# Patient Record
Sex: Female | Born: 1985 | Race: Black or African American | Hispanic: No | Marital: Single | Smoking: Current every day smoker
Health system: Southern US, Community
[De-identification: ages and names within clinical notes are randomized; demographics above are authoritative.]

## PROBLEM LIST (undated history)

## (undated) HISTORY — PX: TUBAL LIGATION: SHX77

---
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
## 2006-08-30 ENCOUNTER — Ambulatory Visit (HOSPITAL_COMMUNITY): Admission: RE | Admit: 2006-08-30 | Discharge: 2006-08-30 | Payer: Self-pay | Admitting: Obstetrics

## 2006-09-20 ENCOUNTER — Ambulatory Visit (HOSPITAL_COMMUNITY): Admission: RE | Admit: 2006-09-20 | Discharge: 2006-09-20 | Payer: Self-pay | Admitting: Obstetrics

## 2006-11-08 ENCOUNTER — Inpatient Hospital Stay (HOSPITAL_COMMUNITY): Admission: RE | Admit: 2006-11-08 | Discharge: 2006-11-11 | Payer: Self-pay | Admitting: Obstetrics

## 2008-12-08 ENCOUNTER — Ambulatory Visit: Payer: Self-pay | Admitting: Family

## 2008-12-08 ENCOUNTER — Inpatient Hospital Stay (HOSPITAL_COMMUNITY): Admission: AD | Admit: 2008-12-08 | Discharge: 2008-12-08 | Payer: Self-pay | Admitting: Obstetrics & Gynecology

## 2009-01-08 ENCOUNTER — Ambulatory Visit (HOSPITAL_COMMUNITY): Admission: RE | Admit: 2009-01-08 | Discharge: 2009-01-08 | Payer: Self-pay | Admitting: Obstetrics

## 2009-05-24 ENCOUNTER — Encounter (INDEPENDENT_AMBULATORY_CARE_PROVIDER_SITE_OTHER): Payer: Self-pay | Admitting: Obstetrics

## 2009-05-24 ENCOUNTER — Inpatient Hospital Stay (HOSPITAL_COMMUNITY): Admission: AD | Admit: 2009-05-24 | Discharge: 2009-05-27 | Payer: Self-pay | Admitting: Obstetrics

## 2010-07-03 ENCOUNTER — Emergency Department (HOSPITAL_COMMUNITY)
Admission: EM | Admit: 2010-07-03 | Discharge: 2010-07-03 | Payer: Self-pay | Source: Home / Self Care | Admitting: Emergency Medicine

## 2010-07-11 LAB — GLUCOSE, CAPILLARY: Glucose-Capillary: 100 mg/dL — ABNORMAL HIGH (ref 70–99)

## 2010-09-28 LAB — CBC
HCT: 26.6 % — ABNORMAL LOW (ref 36.0–46.0)
HCT: 37 % (ref 36.0–46.0)
Hemoglobin: 12.4 g/dL (ref 12.0–15.0)
Hemoglobin: 8.8 g/dL — ABNORMAL LOW (ref 12.0–15.0)
MCHC: 33.3 g/dL (ref 30.0–36.0)
MCHC: 33.4 g/dL (ref 30.0–36.0)
MCV: 91.4 fL (ref 78.0–100.0)
MCV: 91.7 fL (ref 78.0–100.0)
Platelets: 114 K/uL — ABNORMAL LOW (ref 150–400)
Platelets: 158 K/uL (ref 150–400)
RBC: 2.9 MIL/uL — ABNORMAL LOW (ref 3.87–5.11)
RBC: 4.05 MIL/uL (ref 3.87–5.11)
RDW: 13.3 % (ref 11.5–15.5)
RDW: 13.5 % (ref 11.5–15.5)
WBC: 8.7 K/uL (ref 4.0–10.5)
WBC: 9.8 K/uL (ref 4.0–10.5)

## 2010-09-28 LAB — RPR: RPR Ser Ql: NONREACTIVE

## 2010-10-03 LAB — URINALYSIS, ROUTINE W REFLEX MICROSCOPIC
Bilirubin Urine: NEGATIVE
Glucose, UA: NEGATIVE mg/dL
Hgb urine dipstick: NEGATIVE
Ketones, ur: NEGATIVE mg/dL
Nitrite: NEGATIVE
Protein, ur: NEGATIVE mg/dL
Specific Gravity, Urine: <1.005 — ABNORMAL LOW (ref 1.005–1.030)
Urobilinogen, UA: 0.2 mg/dL (ref 0.0–1.0)
pH: 5.5 (ref 5.0–8.0)

## 2010-10-03 LAB — GC/CHLAMYDIA PROBE AMP, GENITAL
Chlamydia, DNA Probe: NEGATIVE
GC Probe Amp, Genital: NEGATIVE

## 2010-10-03 LAB — POCT PREGNANCY, URINE: Preg Test, Ur: POSITIVE

## 2010-10-03 LAB — WET PREP, GENITAL
Clue Cells Wet Prep HPF POC: NONE SEEN
Yeast Wet Prep HPF POC: NONE SEEN

## 2010-10-03 LAB — URINE MICROSCOPIC-ADD ON

## 2010-11-08 NOTE — Op Note (Signed)
Sara Stevens, Sara Stevens                ACCOUNT NO.:  [id number]   MEDICAL RECORD NO.:  [id number]          PATIENT TYPE:  INP   LOCATION:  9199                           FACILITY:   PHYSICIAN:  Kathreen Cosier, M.D.DATE OF BIRTH:  May 07, 1986   DATE OF PROCEDURE:  11/08/2006  DATE OF DISCHARGE:                               OPERATIVE REPORT   PREOPERATIVE DIAGNOSIS:  Previous cesarean section at term, desires  repeat.   POSTOPERATIVE DIAGNOSIS:  Previous cesarean section at term, desires  repeat.   SURGEON:  Kathreen Cosier, MD.   FIRST ASSISTANT:  Charles A. Clearance Coots, MD.   ANESTHESIA:  Epidural.   PROCEDURE:  Patient placed on the operating table in the supine position  after the epidural had been administered, abdomen prepped and draped,  bladder emptied with a Foley catheter.  A transverse suprapubic incision  made and carried down to the rectus fascia, fascia cleaned and incised  the length of the incision.  The recti muscles were retracted laterally,  the peritoneum incised longitudinally.  A transverse incision made over  the visceral peritoneum above the bladder, the bladder mobilized  inferiorly.  A transverse lower uterine incision made.  Fluid was clear.  Patient delivered from the LOA position of a female, Apgar 8 and 9,  weighing 7 pounds 10 ounces, team in attendance.  The placenta anterior  fundal removed manually.  The uterine cavity cleaned with dry laps.  The  uterine incision closed in one layer with continuous suture of #1  chromic.  Hemostasis was satisfactory.  Bladder flap reattached with #2-  0 chromic.  The uterus was contracted, tubes and ovaries normal.  Abdomen closed in layers, peritoneum continuous suture of #0 chromic,  fascia continuous suture of #0 Dexon, and the skin closed with  subcuticular stitch of #4-0 Monocryl.  The patient tolerated the  procedure well, taken to the recovery room in good condition.     ______________________________  Kathreen Cosier, M.D.     BAM/MEDQ  D:  11/08/2006  T:  11/08/2006  Job:  [id number]

## 2010-11-11 NOTE — H&P (Signed)
   NAME:  Sara Stevens, Sara Stevens                          ACCOUNT NO.:  [id number]   MEDICAL RECORD NO.:  [id number]                   PATIENT TYPE:  INP   LOCATION:  9115                                 [facility]ACILITY[facility]   PHYSICIAN:  Kathreen Cosier, M.D.           DATE OF BIRTH:  04-20-1986   DATE OF ADMISSION:  04/02/2003  DATE OF DISCHARGE:                                HISTORY & PHYSICAL   HISTORY:  The patient is a 25 year old primigravida, [name] April 01, 2003.  She was in for induction at term at patient's request.  The cervix was 2 cm,  70%, and the vertex -3 station.  The membranes were ruptured at 12:15 p.m.  The fluid was clear.  Negative GBS.  The patient started contracting on her  own and by 3 p.m. she was contracting every four to five minutes.  Pitocin  was begun at that time.  At 4:30 p.m. IUPC was inserted.  She was having  mild variable contractions.  Cervix was still 2 cm, vertex, -3.  At 10:15  p.m., cervix 3 cm, more vertex, -3 with a lot of molding.  MVUs were greater  than 200 and she had had an Amnioinfusion going because of variable  decelerations.  11:30 p.m. cervix was unchanged.  It was decided that she  would be delivered by cesarean section for failure to progress in labor.   PHYSICAL EXAMINATION:  GENERAL:  Well-developed female in labor.  HEENT:  Negative.  LUNGS:  Clear.  HEART:  Regular rhythm.  No murmurs or gallops.  BREASTS:  Negative.  ABDOMEN:  Term sized uterus.  Estimated fetal weight 6 pounds 10 ounces.  PELVIC:  As described above.  EXTREMITIES:  Negative.                                               Kathreen Cosier, M.D.    BAM/MEDQ  D:  04/03/2003  T:  04/03/2003  Job:  [id number]

## 2010-11-11 NOTE — Op Note (Signed)
[name], Sara Stevens                ACCOUNT NO.:  [id number]   MEDICAL RECORD NO.:  [id number]          PATIENT TYPE:  INP   LOCATION:  9126                          [facility]ACILITY[facility]  [facility]   PHYSICIAN:  Kathreen Cosier, M.D.DATE OF BIRTH:  [birth year]   DATE OF PROCEDURE:  05/24/2005  DATE OF DISCHARGE:                                 OPERATIVE REPORT   PREOPERATIVE DIAGNOSIS:  Previous cesarean section at term, desires repeat.   SURGEON:  Kathreen Cosier, M.D.   FIRST ASSISTANT:  Charles A. Clearance Coots, M.D.   ANESTHESIA:  Spinal.   PROCEDURE:  Patient placed on the operating room table in supine position,  spinal administered, abdomen prepped and draped, bladder emptied with a  Foley catheter.  A transverse suprapubic incision made through the old scar  and carried down to the rectus fascia, the fascia cleaned and incised the  length of the incision.  Recti muscles retracted laterally, peritoneum  incised longitudinally.  A transverse incision made in the visceral  peritoneum above the bladder and the bladder mobilized inferiorly.  A  transverse lower uterine incision made.  The patient delivered from the OP  position of a female, Apgar 9 and 9, weight was 7 pounds 5 ounces.  The team  was in attendance, and the fluid was clear.  Placenta posterior, removed  manually, uterine cavity cleaned with dry laps.  The uterine incision closed  in one layer with continuous suture of #1 chromic.  Hemostasis was  satisfactory.  Bladder flap reattached with 2-0 chromic.  The uterus well-  contracted, tubes and ovaries normal.  Abdomen closed in layers, the  peritoneum with a continuous suture of 0 chromic, the fascia with continuous  suture of 0 Dexon and the skin closed with a subcuticular stitch of 4-0  Monocryl.  Blood loss 500 mL.  The patient tolerated her procedure well, was  taken to the recovery room in good condition.           ______________________________  Kathreen Cosier,  M.D.     BAM/MEDQ  D:  05/24/2005  T:  05/24/2005  Job:  [id number]

## 2010-11-11 NOTE — Op Note (Signed)
   NAME:  Sara Stevens, KINDIG                          ACCOUNT NO.:  [id number]   MEDICAL RECORD NO.:  [id number]                   PATIENT TYPE:  INP   LOCATION:  9115                                 [facility]ACILITY[facility]   PHYSICIAN:  Kathreen Cosier, M.D.           DATE OF BIRTH:  November 29, 1985   DATE OF PROCEDURE:  04/02/2003  DATE OF DISCHARGE:                                 OPERATIVE REPORT   PREOPERATIVE DIAGNOSIS:  Failure to progress in labor.   SURGEON:  Kathreen Cosier, M.D.   ANESTHESIA:  Epidural.   DESCRIPTION OF PROCEDURE:  Patient placed on the operating table in supine  position, abdomen prepped and draped, bladder emptied with a Foley catheter.  A transverse suprapubic incision made and carried down to the rectus fascia,  the fascia cleaned and incised the length of the incision.  The recti  muscles were retracted laterally, the peritoneum incised longitudinally.  A  transverse incision made in the visceral peritoneum above the bladder and  the bladder mobilized inferiorly.  A transverse lower uterine incision made.  The patient delivered from the OP position of a female, Apgars 9 and 9, weight  7 pounds 12 ounces.  The team was in attendance.  The fluid was clear.  The  placenta was anterior and removed manually.  The uterine cavity cleaned with  dry laps.  The uterine incision closed in one layer with continuous suture  of #1 chromic.  Hemostasis was satisfactory.  Bladder flap reattached with 2-  0 chromic.  Uterus well-contracted.  Tubes and ovaries normal.  Abdomen  closed in layers, peritoneum with continuous suture of 0 chromic, fascia  with continuous suture of 0 Dexon, the skin closed with subcuticular stitch  of 3-0 Monocryl.  Blood loss 500 mL.  The patient tolerates her procedure  well.                                               Kathreen Cosier, M.D.    BAM/MEDQ  D:  04/03/2003  T:  04/03/2003  Job:  [id number]

## 2010-11-11 NOTE — Discharge Summary (Signed)
Sara Stevens, Sara Stevens                ACCOUNT NO.:  [id number]   MEDICAL RECORD NO.:  [id number]          PATIENT TYPE:  INP   LOCATION:  9112                          FACILITY:  [facility]   PHYSICIAN:  Kathreen Cosier, M.D.DATE OF BIRTH:  [birth year]   DATE OF ADMISSION:  11/08/2006  DATE OF DISCHARGE:  11/11/2006                               DISCHARGE SUMMARY   The patient is a 25 year old, gravida 3, para 2-0-0-2 who had 2 previous  cesarean sections. Her due date is Nov 14, 2006, and she is now at term  and for repeat cesarean section. She had a repeat low transverse  Cesarean section, had a female, Apgars 8 and 9, weighing 7 pound 10  ounces, from the LOA position. Postoperatively, she did well. On  admission, her hemoglobin was 11.4, postoperative 8, platelets 218 and  144. PT/PTT normal. Urine normal. RPR nonreactive. HIV negative. The  patient was discharged on the third postoperative day, ambulatory on a  regular diet on Tylox for pain, to see me in 6 weeks.   DISCHARGE DIAGNOSIS:  Status post repeat low transverse Cesarean section  at term.           ______________________________  Kathreen Cosier, M.D.     BAM/MEDQ  D:  12/19/2006  T:  12/19/2006  Job:  [id number]

## 2010-11-11 NOTE — Discharge Summary (Signed)
Sara Stevens, RICCARDI                ACCOUNT NO.:  [id number]   MEDICAL RECORD NO.:  [id number]          PATIENT TYPE:  INP   LOCATION:  9126                          [facility]ILITY[facility]   PHYSICIAN:  Kathreen Cosier, M.D.DATE OF BIRTH:  [birth year]   DATE OF ADMISSION:  05/24/2005  DATE OF DISCHARGE:  05/27/2005                                 DISCHARGE SUMMARY   HOSPITAL COURSE:  The patient is an 25 year old gravida 2, para 1-0-0-1, had  a previous C-section and her due date was at June 02, 2005.  She desired  a repeat C-section.  On admission, her GBS was positive.  The patient  underwent a repeat low-transverse cesarean section, and she had a female.  Apgar 9 and 9 weighing 7 pounds and 5 ounces.  Postoperatively, she did  well.  On admission, her hemoglobin was 10.9 and 8 postop.  Platelets 251  and 187, white count 7 and 8.5.  Sodium 138, potassium 3.8, chloride 108,  CO2 23.  The patient was discharged on the third postoperative day  ambulatory on a regular diet.  To see me in six weeks.   DISCHARGE DIAGNOSIS:  Status post repeat low-transverse cesarean section at  term.           ______________________________  Kathreen Cosier, M.D.     BAM/MEDQ  D:  06/14/2005  T:  06/14/2005  Job:  [id number]

## 2010-11-11 NOTE — Discharge Summary (Signed)
   NAME:  Sara Stevens, Sara Stevens                          ACCOUNT NO.:  [id number]   MEDICAL RECORD NO.:  [id number]                   PATIENT TYPE:  INP   LOCATION:  9115                                 [facility]ILITY[facility]   PHYSICIAN:  Kathreen Cosier, M.D.           DATE OF BIRTH:  10/24/1985   DATE OF ADMISSION:  04/02/2003  DATE OF DISCHARGE:  04/06/2003                                 DISCHARGE SUMMARY   The patient is a 25 year old primigravida with estimated date of confinement  April 01, 2003 for elective induction.  Cervix 2 cm, 70%, vertex at -3  station.  She had artificial rupture of membranes with clear fluid.  She had  negative GBS.  The patient underwent a primary low transverse cesarean  section because of failure to progress and she had female infant with Apgar's  of 9 and 9, weighing 7 pounds, 12 ounces, from the occiput posterior  position.  Postoperatively she did well.  Her hemoglobin was 8.5.  She was  discharged home on the third postoperative day ambulatory on a regular diet.  On Tylox for pain and ferrous sulfate for anemia and Ortho-Evra for  contraception.   DISCHARGE DIAGNOSIS:  Status post primary low transverse cesarean section at  term for failure to progress in labor.                                                Kathreen Cosier, M.D.    BAM/MEDQ  D:  04/06/2003  T:  04/06/2003  Job:  [id number]

## 2010-11-27 IMAGING — US US OB COMP +14 WK
1 of 2 series · 14 of 28 positions shown · non-contrast
Comparison: none

OBSTETRICAL ULTRASOUND:
 This ultrasound exam was performed in the [HOSPITAL] Ultrasound Department.  The OB US report was generated in the AS system, and faxed to the ordering physician.  This report is also available in [REDACTED] PACS.

[Series 1: us ob comp +14 wk · 0.22mm/px · 80 acquisitions, 14 frames shown]
[im 1/80]
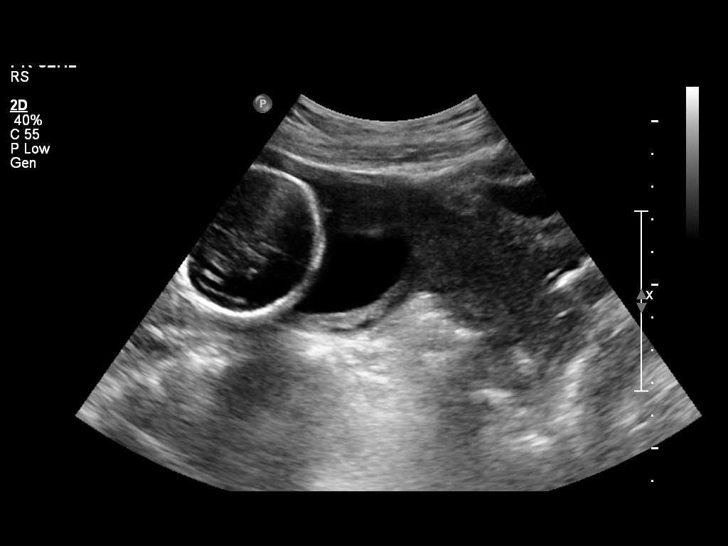
[im 7/80]
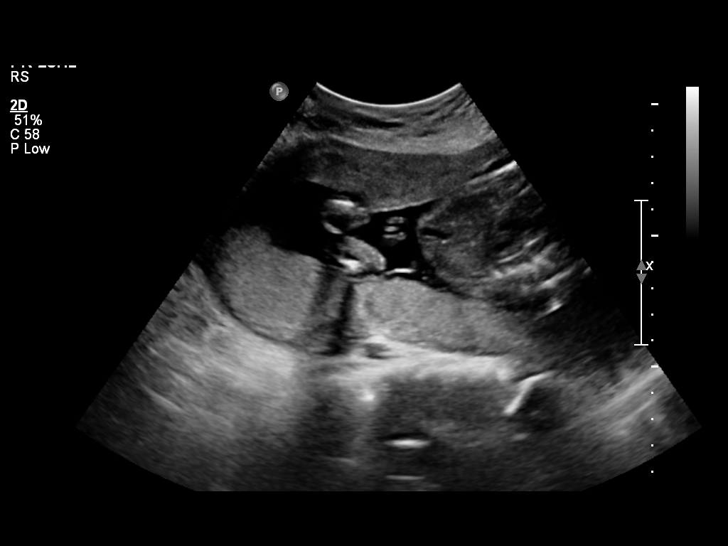
[im 13/80]
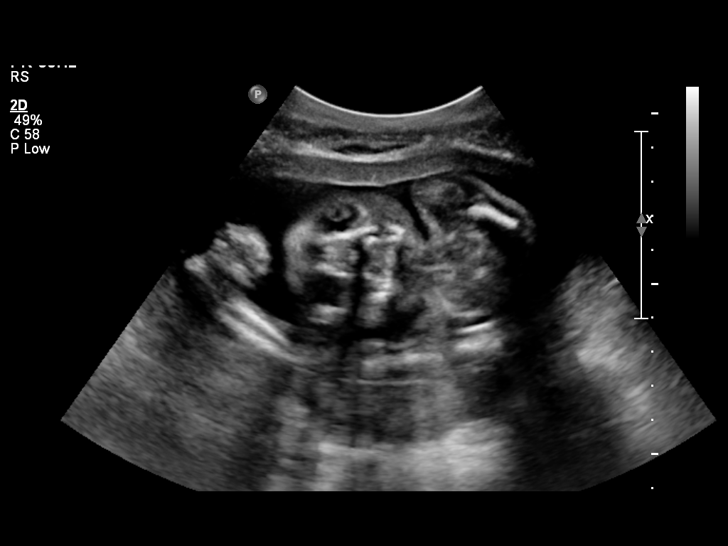
[im 19/80]
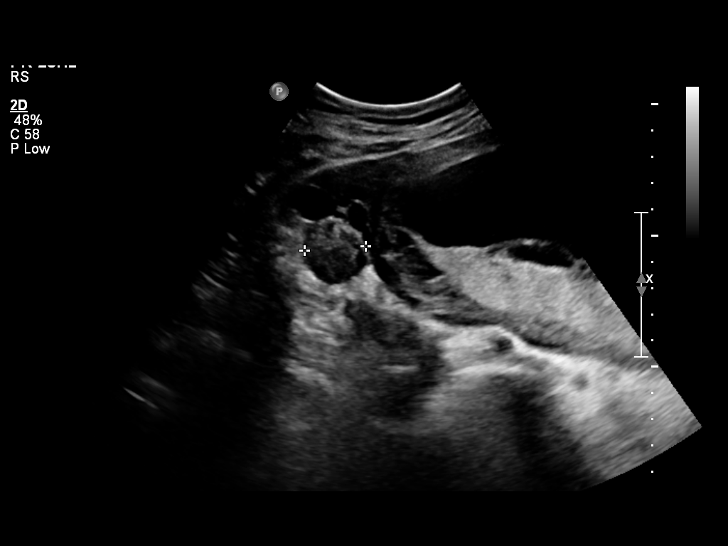
[im 25/80]
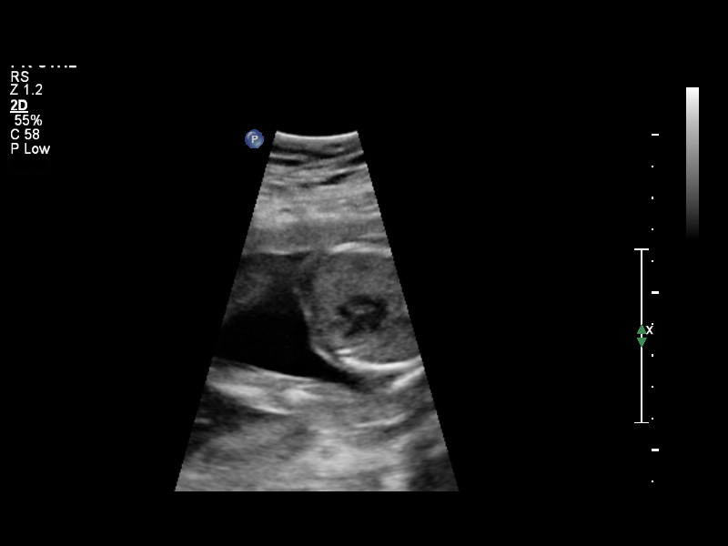
[im 31/80]
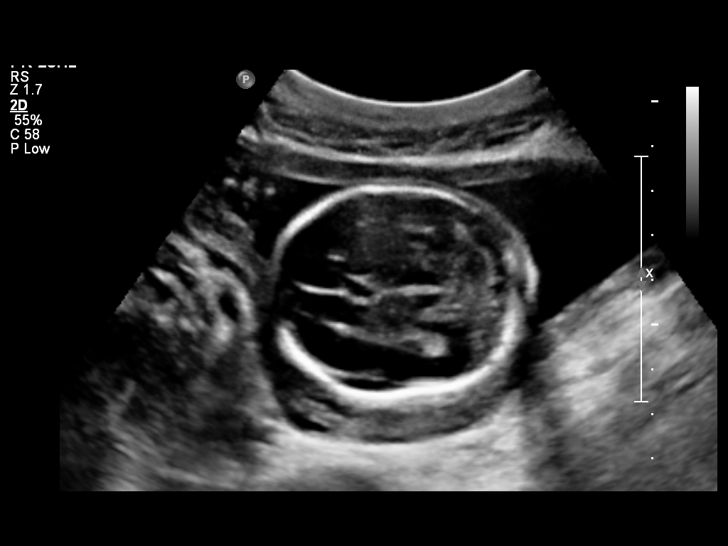
[im 37/80]
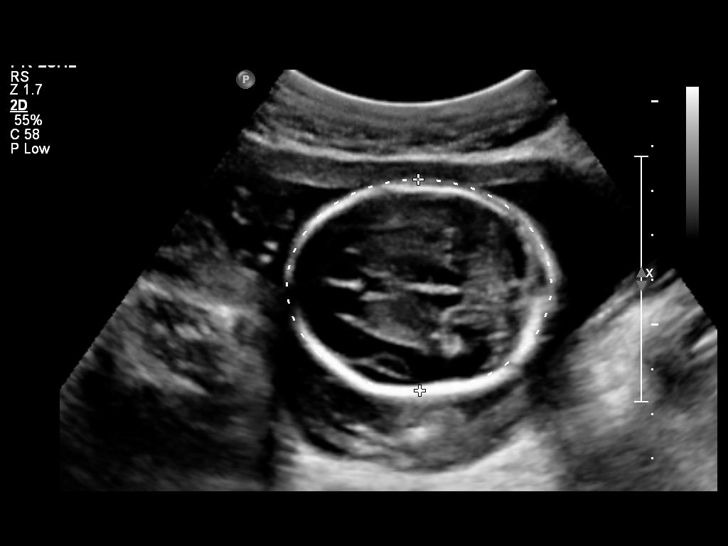
[im 43/80]
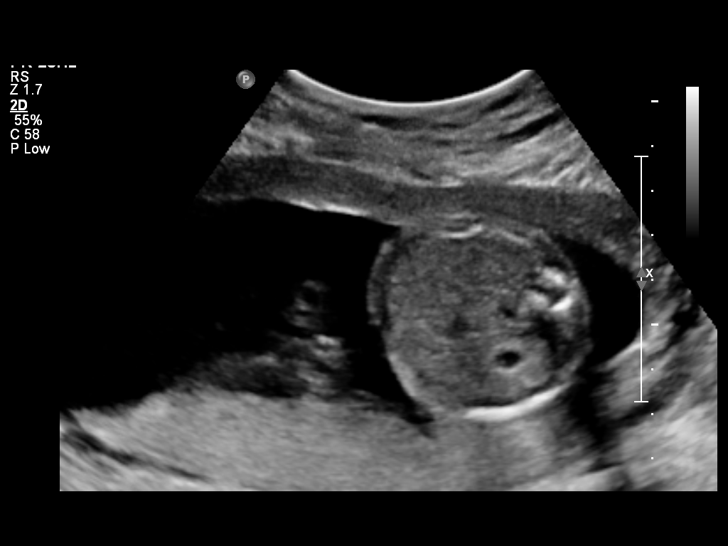
[im 49/80]
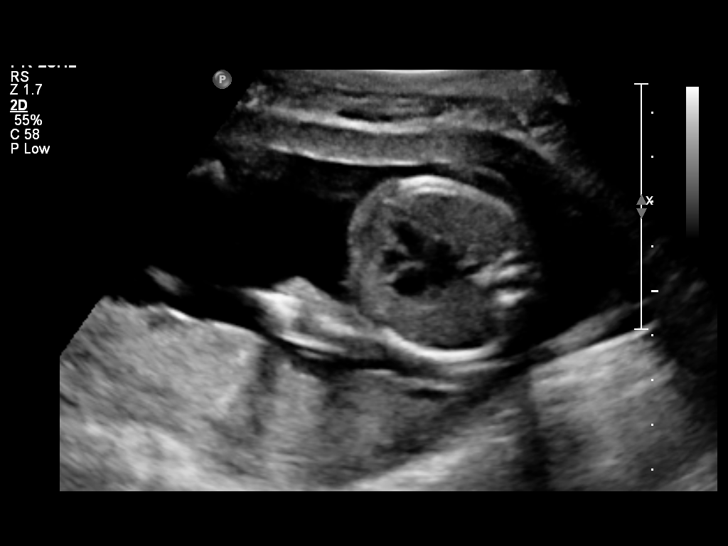
[im 55/80]
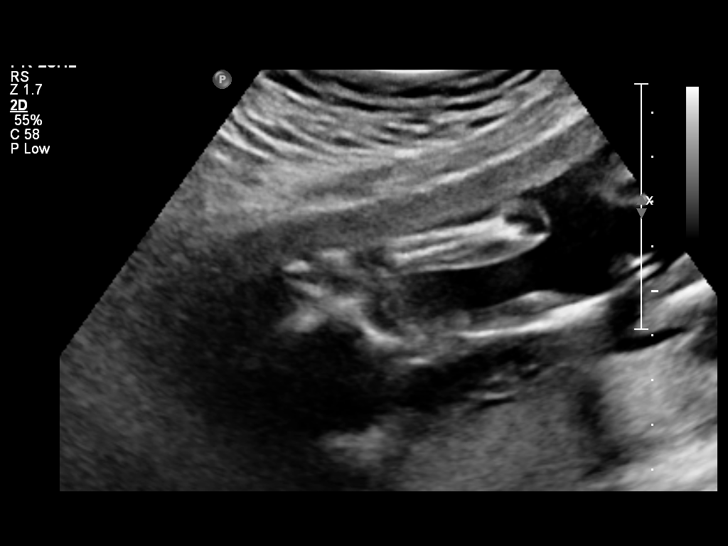
[im 61/80]
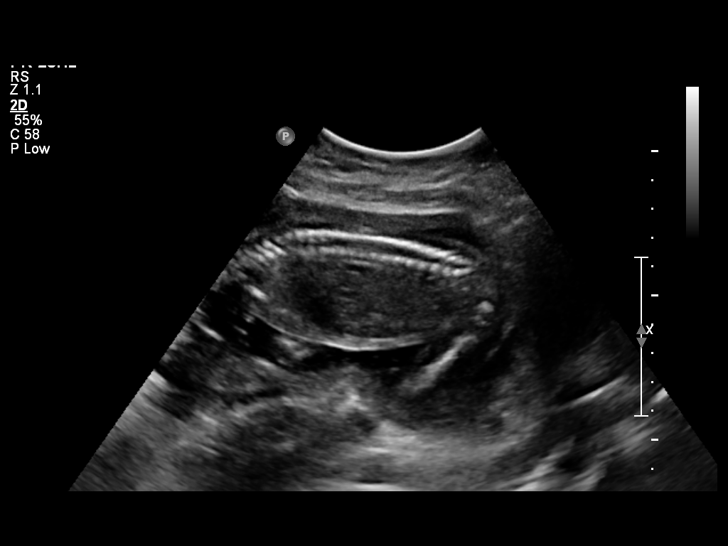
[im 67/80]
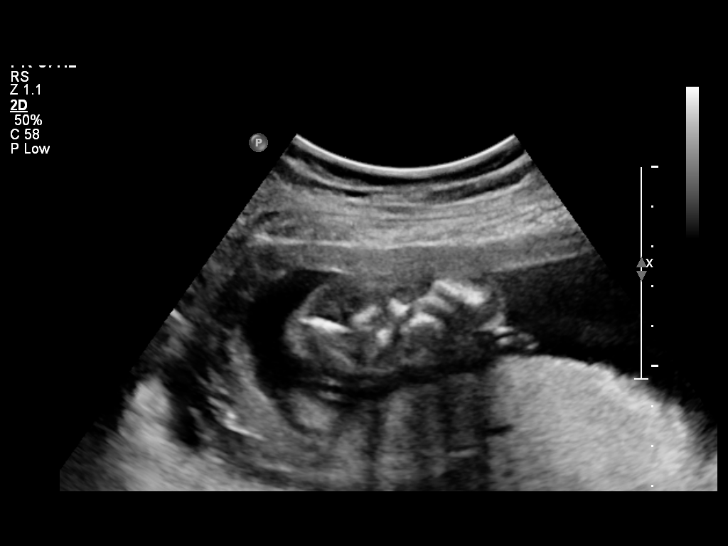
[im 73/80]
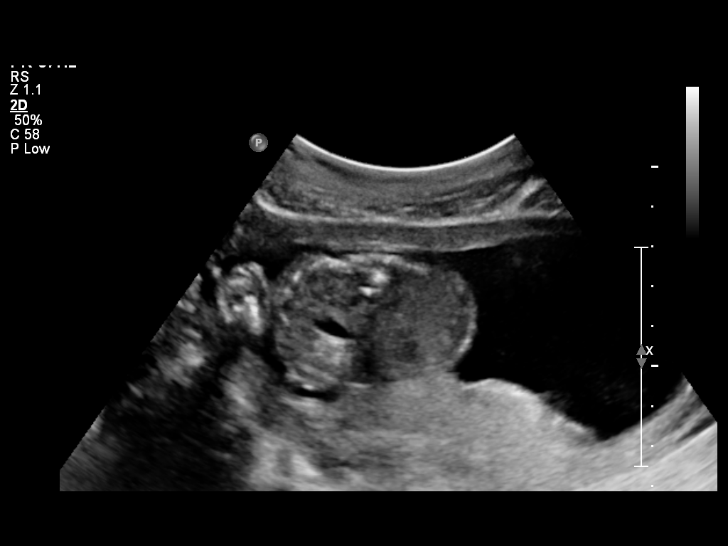
[im 80/80]
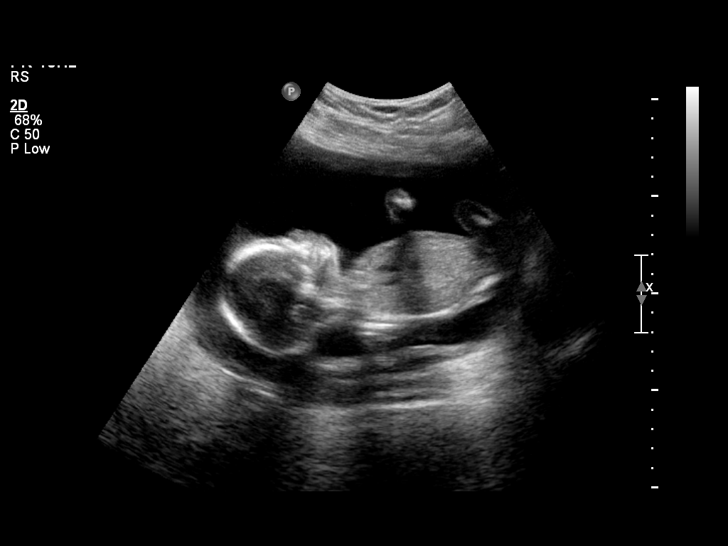

[14 of 28 positions shown; findings below may reference images not displayed]

IMPRESSION: See AS Obstetric US report.

## 2012-02-08 ENCOUNTER — Emergency Department (HOSPITAL_COMMUNITY)
Admission: EM | Admit: 2012-02-08 | Discharge: 2012-02-08 | Disposition: A | Discharge status: Home / Self Care | Payer: Medicaid Other | Attending: Emergency Medicine | Admitting: Emergency Medicine

## 2012-02-08 ENCOUNTER — Encounter (HOSPITAL_COMMUNITY): Payer: Self-pay | Admitting: Emergency Medicine

## 2012-02-08 DIAGNOSIS — S43499A Other sprain of unspecified shoulder joint, initial encounter: Secondary | ICD-10-CM | POA: Insufficient documentation

## 2012-02-08 DIAGNOSIS — M62838 Other muscle spasm: Secondary | ICD-10-CM

## 2012-02-08 MED ORDER — IBUPROFEN 800 MG PO TABS: 800.0000 mg | ORAL_TABLET | Freq: Three times a day (TID) | ORAL | Status: AC
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
## 2012-02-13 NOTE — ED Provider Notes (Signed)
Medical screening examination/treatment/procedure(s) were performed by non-physician practitioner and as supervising physician I was immediately available for consultation/collaboration.   Alanta Scobey, MD 02/13/12 0655 

## 2012-04-15 ENCOUNTER — Encounter (HOSPITAL_COMMUNITY): Payer: Self-pay

## 2012-04-15 ENCOUNTER — Emergency Department (HOSPITAL_COMMUNITY): Payer: Medicaid Other

## 2012-04-15 ENCOUNTER — Emergency Department (HOSPITAL_COMMUNITY)
Admission: EM | Admit: 2012-04-15 | Discharge: 2012-04-15 | Disposition: A | Discharge status: Home / Self Care | Payer: Medicaid Other | Attending: Emergency Medicine | Admitting: Emergency Medicine

## 2012-04-15 DIAGNOSIS — IMO0002 Reserved for concepts with insufficient information to code with codable children: Secondary | ICD-10-CM | POA: Insufficient documentation

## 2012-04-15 DIAGNOSIS — Y92410 Unspecified street and highway as the place of occurrence of the external cause: Secondary | ICD-10-CM | POA: Insufficient documentation

## 2012-04-15 DIAGNOSIS — Y939 Activity, unspecified: Secondary | ICD-10-CM | POA: Insufficient documentation

## 2012-04-15 MED ORDER — HYDROCODONE-ACETAMINOPHEN 5-325 MG PO TABS
1.0000 | ORAL_TABLET | Freq: Once | ORAL | Status: AC
  Administered 2012-04-15: 1 via ORAL
  Filled 2012-04-15: qty 1

## 2012-04-15 NOTE — ED Notes (Signed)
Patient transported to X-ray 
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
## 2013-12-11 ENCOUNTER — Emergency Department (HOSPITAL_COMMUNITY)
Admission: EM | Admit: 2013-12-11 | Discharge: 2013-12-12 | Disposition: A | Discharge status: Home / Self Care | Payer: Medicaid Other | Attending: Emergency Medicine | Admitting: Emergency Medicine

## 2013-12-11 ENCOUNTER — Encounter (HOSPITAL_COMMUNITY): Payer: Self-pay | Admitting: Emergency Medicine

## 2013-12-11 DIAGNOSIS — R0789 Other chest pain: Secondary | ICD-10-CM | POA: Insufficient documentation

## 2013-12-11 DIAGNOSIS — L02419 Cutaneous abscess of limb, unspecified: Secondary | ICD-10-CM | POA: Insufficient documentation

## 2013-12-11 DIAGNOSIS — L03119 Cellulitis of unspecified part of limb: Secondary | ICD-10-CM

## 2013-12-11 DIAGNOSIS — L02416 Cutaneous abscess of left lower limb: Secondary | ICD-10-CM

## 2013-12-11 NOTE — ED Notes (Signed)
Patient is alert and oriented x3.  She is complaining of intermittent chest pain that has been ongoing For over a month.  She also has complaints of insect bites to the left lower leg.  Currently she rates her  Pain 7 of 10 generalized.

## 2013-12-12 ENCOUNTER — Emergency Department (HOSPITAL_COMMUNITY): Payer: Medicaid Other

## 2013-12-12 MED ORDER — CLINDAMYCIN HCL 300 MG PO CAPS
300.0000 mg | ORAL_CAPSULE | Freq: Once | ORAL | Status: AC
  Administered 2013-12-12: 300 mg via ORAL
  Filled 2013-12-12: qty 1

## 2013-12-12 MED ORDER — CLINDAMYCIN HCL 300 MG PO CAPS: 300.0000 mg | ORAL_CAPSULE | Freq: Four times a day (QID) | ORAL | Status: DC

## 2013-12-12 NOTE — ED Provider Notes (Addendum)
Medical screening examination/treatment/procedure(s) were performed by non-physician practitioner and as supervising physician I was immediately available for consultation/collaboration.   EKG Interpretation   Date/Time:  Thursday December 11 2013 23:32:42 EDT Ventricular Rate:  99 PR Interval:  139 QRS Duration: 78 QT Interval:  352 QTC Calculation: 452 R Axis:   41 Text Interpretation:  Sinus rhythm Borderline T wave abnormalities not  Confirmed by Iness Pangilinan  MD, Lovelee Forner ([id number]) on 12/12/2013 5:24:53 AM       Olivia Mackielga M Debrah Granderson, MD 12/12/13 [id number]  Olivia Mackielga M Hektor Huston, MD 12/12/13 [phone]

## 2013-12-12 NOTE — Discharge Instructions (Signed)
Your provider's are concern for skin infections in your leg. Please take the antibiotics as prescribed and followup with a primary care provider to be sure your infection is improving. Please also followup with a primary care provider for evaluation of your chest pain symptoms.   Abscess An abscess is an infected area that contains a collection of pus and debris.It can occur in almost any part of the body. An abscess is also known as a furuncle or boil. CAUSES  An abscess occurs when tissue gets infected. This can occur from blockage of oil or sweat glands, infection of hair follicles, or a minor injury to the skin. As the body tries to fight the infection, pus collects in the area and creates pressure under the skin. This pressure causes pain. People with weakened immune systems have difficulty fighting infections and get certain abscesses more often.  SYMPTOMS Usually an abscess develops on the skin and becomes a painful mass that is red, warm, and tender. If the abscess forms under the skin, you may feel a moveable soft area under the skin. Some abscesses break open (rupture) on their own, but most will continue to get worse without care. The infection can spread deeper into the body and eventually into the bloodstream, causing you to feel ill.  DIAGNOSIS  Your caregiver will take your medical history and perform a physical exam. A sample of fluid may also be taken from the abscess to determine what is causing your infection. TREATMENT  Your caregiver may prescribe antibiotic medicines to fight the infection. However, taking antibiotics alone usually does not cure an abscess. Your caregiver may need to make a small cut (incision) in the abscess to drain the pus. In some cases, gauze is packed into the abscess to reduce pain and to continue draining the area. HOME CARE INSTRUCTIONS   Only take over-the-counter or prescription medicines for pain, discomfort, or fever as directed by your  caregiver.  If you were prescribed antibiotics, take them as directed. Finish them even if you start to feel better.  If gauze is used, follow your caregiver's directions for changing the gauze.  To avoid spreading the infection:  Keep your draining abscess covered with a bandage.  Wash your hands well.  Do not share personal care items, towels, or whirlpools with others.  Avoid skin contact with others.  Keep your skin and clothes clean around the abscess.  Keep all follow-up appointments as directed by your caregiver. SEEK MEDICAL CARE IF:   You have increased pain, swelling, redness, fluid drainage, or bleeding.  You have muscle aches, chills, or a general ill feeling.  You have a fever. MAKE SURE YOU:   Understand these instructions.  Will watch your condition.  Will get help right away if you are not doing well or get worse. Document Released: 03/22/2005 Document Revised: 12/12/2011 Document Reviewed: 08/25/2011 [facility] Patient Information 2015 [employer], [place]. This information is not intended to replace advice given to you by your health care provider. Make sure you discuss any questions you have with your health care provider.

## 2013-12-12 NOTE — ED Notes (Signed)
Pt states started w/ 1 bite on L lower leg, then had about 3 more bite spots appear, bite marks are reddened w/ white head, states painful also, pt states while she is here for the bite marks she also wants to get her chest pain that has been intermittent x 1 month checked out, states when the chest pain does come she feels like she's being "smoothered", states has SOB w/ it, central chest. Pt resting in bed at this time, in no distress.

## 2013-12-12 NOTE — ED Provider Notes (Signed)
CSN: [id number]     Arrival date & time 12/11/13  2326 History   First MD Initiated Contact with Patient 12/12/13 0057     Chief Complaint  Patient presents with  . Chest Pain    intermittent  . Insect Bite   HPI  History provided by the patient. Patient is a 28 year old female with no significant PMH who presents with complaints of areas of redness and swelling and pain to her left lower leg. Symptoms first began 2 days ago. She is concerned of possible insect bites. She denies any itching to the areas. She first had one spot which she popped and drained small amounts of pus and blood. This seemed to improve slightly but since then 3 other areas have arisen on different areas of the leg. She does shave her legs with a razor. Denies having similar symptoms previously. There has been no associated fever, chills or sweats. Patient also has secondary complaint of some continued chronic chest discomfort. She states that she also wanted to mention it because it has not improved significantly. Over the past several months she has had intermittent chest heaviness that feels like a squeezing sensation. This most often occurs when she lays flat but can occur anytime throughout the day. It usually only lasts several minutes and is accompanied with slight shortness of breath. She states she does not feel like she cannot fully breath in. Denies feeling anxious at this time. Denies having any indigestion, heartburn or belching.    History reviewed. No pertinent past medical history. History reviewed. No pertinent past surgical history. No family history on file. History  Substance Use Topics  . Smoking status: Never Smoker   . Smokeless tobacco: Not on file  . Alcohol Use: No   OB History   Grav Para Term Preterm Abortions TAB SAB Ect Mult Living                 Review of Systems  Constitutional: Positive for fever. Negative for chills, diaphoresis, appetite change and unexpected weight change.   HENT: Negative for congestion, rhinorrhea and sore throat.   Respiratory: Positive for shortness of breath. Negative for cough.   Cardiovascular: Positive for chest pain. Negative for palpitations.  Genitourinary: Negative for dysuria, frequency, hematuria and flank pain.  All other systems reviewed and are negative.     Allergies  Review of patient's allergies indicates no known allergies.  Home Medications   Prior to Admission medications   Medication Sig Start Date End Date Taking? Authorizing Provider  ibuprofen (ADVIL,MOTRIN) 200 MG tablet Take 400 mg by mouth every 6 (six) hours as needed for moderate pain.   Yes Historical Provider, MD  Ibuprofen-Diphenhydramine Cit (ADVIL PM PO) Take 2 tablets by mouth at bedtime as needed (pain/sleep).   Yes Historical Provider, MD   BP 136/78  Pulse 100  Temp(Src) 100 F (37.8 C) (Oral)  Resp 18  SpO2 99%  LMP 11/12/2013 Physical Exam  Nursing note and vitals reviewed. Constitutional: She is oriented to person, place, and time. She appears well-developed and well-nourished. No distress.  HENT:  Head: Normocephalic.  Cardiovascular: Normal rate and regular rhythm.   No murmur heard. Pulmonary/Chest: Effort normal and breath sounds normal. No respiratory distress. She has no wheezes. She has no rales.  Abdominal: Soft. There is no tenderness.  Neurological: She is alert and oriented to person, place, and time.  Skin: Skin is warm and dry. No rash noted.  4 small erythematous nodules with central  pustule.   Psychiatric: She has a normal mood and affect. Her behavior is normal.    ED Course  Procedures   COORDINATION OF CARE:  Nursing notes reviewed. Vital signs reviewed. Initial pt interview and examination performed.   Filed Vitals:   12/11/13 2333  BP: 136/78  Pulse: 100  Temp: 100 F (37.8 C)  TempSrc: Oral  Resp: 18  SpO2: 99%    1:35 AM- patient seen and evaluated. She is well appearing in no acute distress.  Afebrile. Does appear severely ill or toxic. Does have several small areas concerning for staph infection of the skin to the leg.   small needle I&D performed to several areas with only a small amount of purulent drainage. Will place patient on clindamycin and instructed to use warm compresses. She has been instructed to replace her razor and not shave for the next several days.  Chest x-ray and EKG unremarkable. No concerning findings for her ongoing intermittent chest discomforts. She does not have history of hypertension, diabetes or hypercholesterolemia. Nonsmoker. She does not have any significant risk factors for ACS. She is PERC negative. Symptoms are atypical and chronic. Doubt any emergent cause for her chest discomfort. We'll recommend continued PCP followup for evaluation.   Imaging Review Dg Chest 2 View  12/12/2013   CLINICAL DATA:  CHEST PAIN INSECT BITE  EXAM: CHEST  2 VIEW  COMPARISON:  None.  FINDINGS: The heart size and mediastinal contours are within normal limits. Both lungs are clear. The visualized skeletal structures are unremarkable.  IMPRESSION: No active cardiopulmonary disease.   Electronically Signed   By: Andreas NewportGeoffrey  Lamke M.D.   On: 12/12/2013 01:48     EKG Interpretation None      Date: 12/12/2013  Rate: 99  Rhythm: normal sinus rhythm  QRS Axis: normal  Intervals: normal  ST/T Wave abnormalities: normal  Conduction Disutrbances:none  Narrative Interpretation:   Old EKG Reviewed: none available    MDM   Final diagnoses:  Abscess of leg, left  Atypical chest pain        Angus Sellereter S Dammen, PA-C 12/12/13 0256

## 2015-10-31 IMAGING — CR DG CHEST 2V
2 series · 2 of 2 positions shown · non-contrast
Comparison: None.

CLINICAL DATA: CHEST PAIN INSECT BITE

EXAM:
CHEST  2 VIEW

[w chest pa]
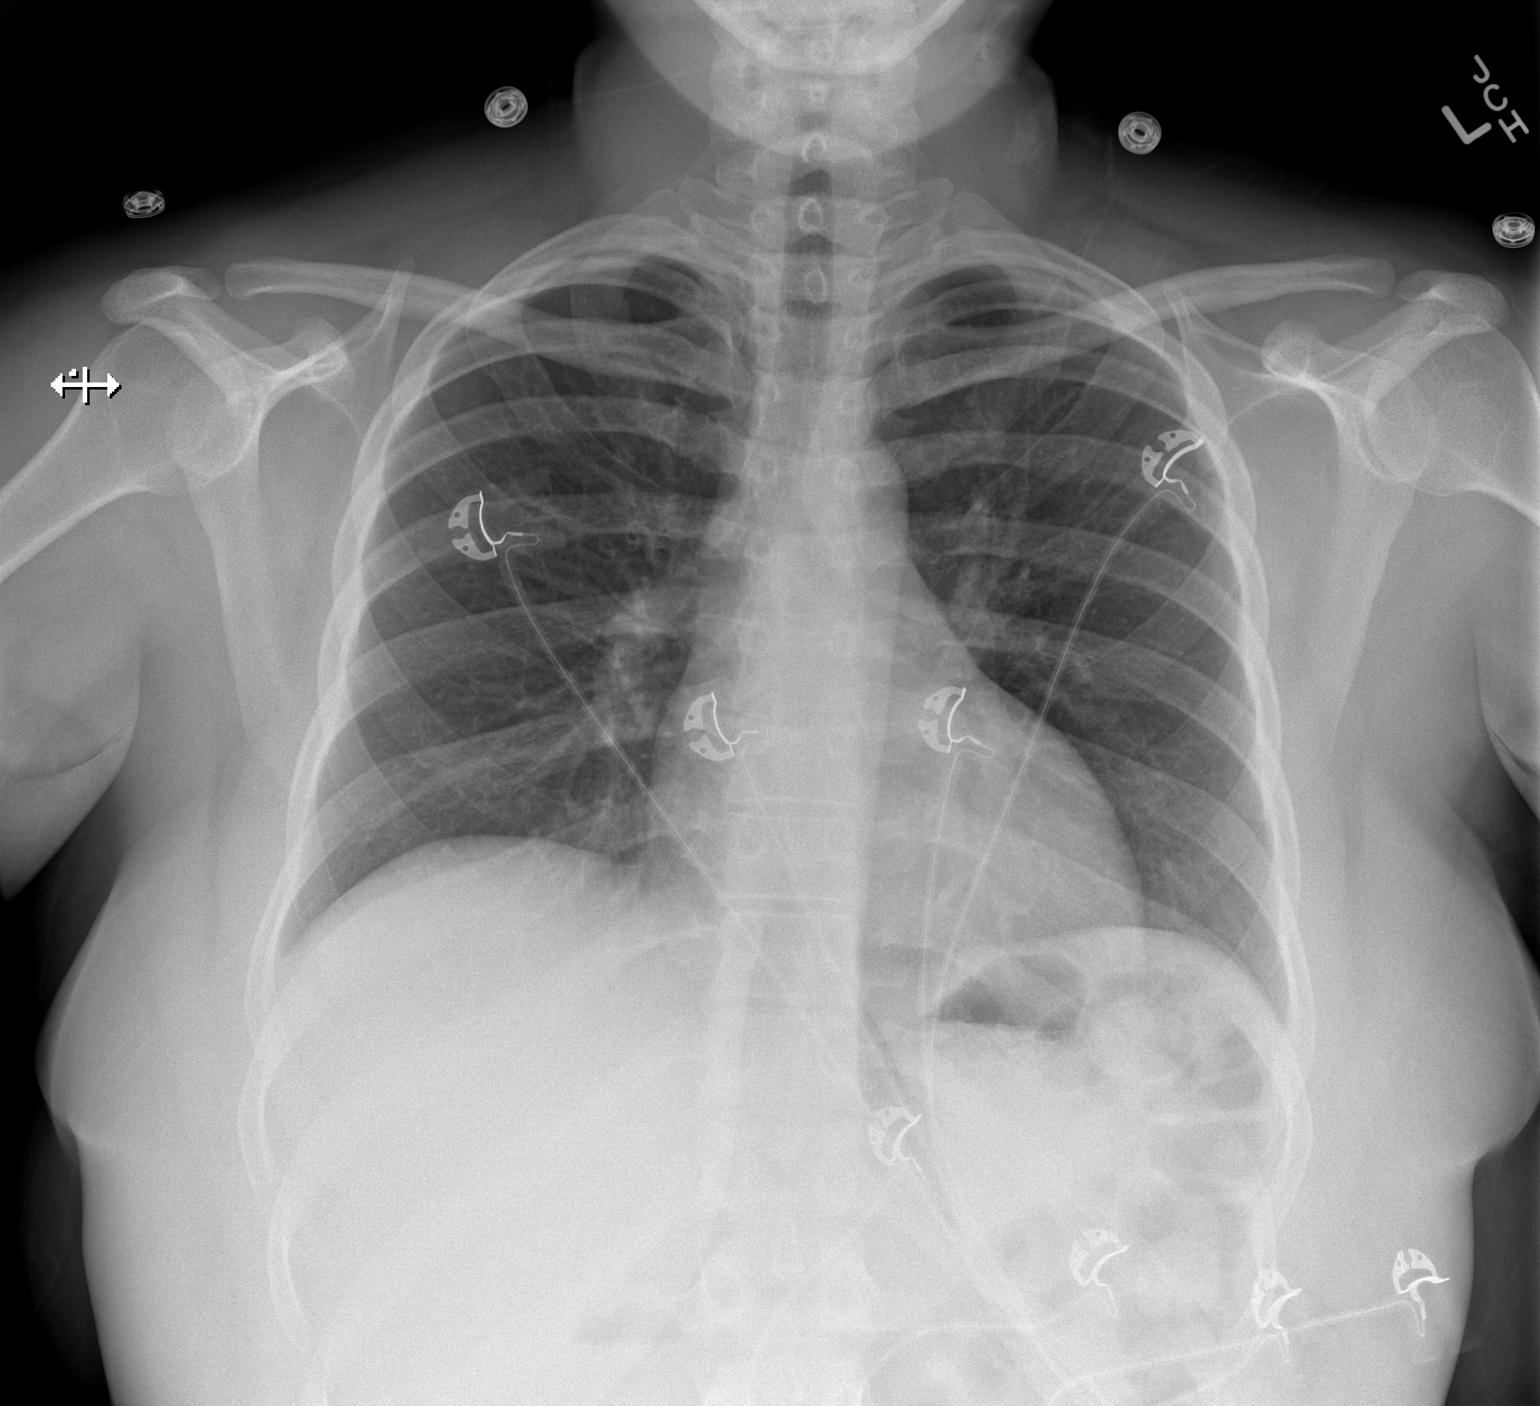

[w chest lat]
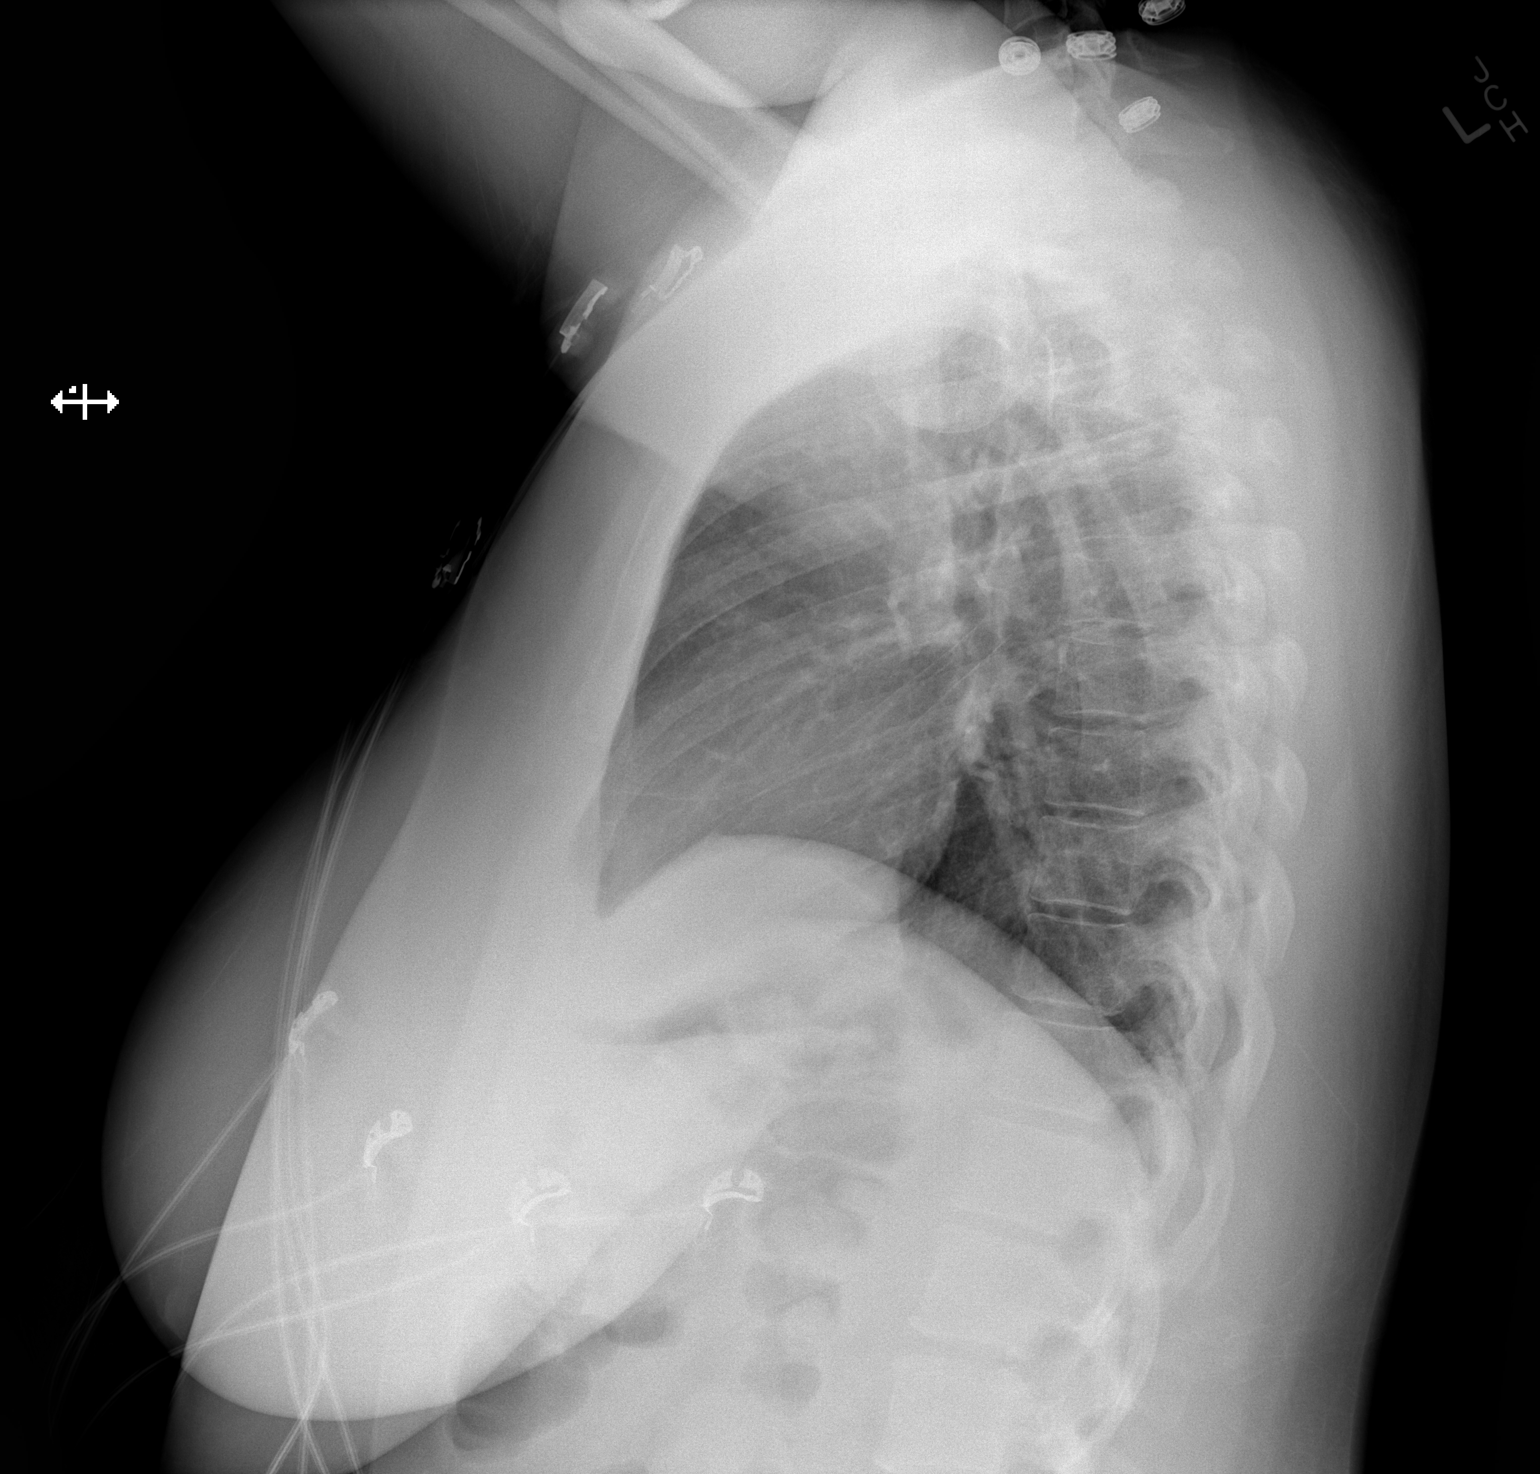

[2 of 2 positions shown; findings below may reference images not displayed]

FINDINGS: The heart size and mediastinal contours are within normal limits.
Both lungs are clear. The visualized skeletal structures are
unremarkable.
IMPRESSION: No active cardiopulmonary disease.

## 2016-12-11 ENCOUNTER — Ambulatory Visit (HOSPITAL_COMMUNITY)
Admission: EM | Admit: 2016-12-11 | Discharge: 2016-12-11 | Disposition: A | Discharge status: Home / Self Care | Payer: Medicaid Other | Attending: Family Medicine | Admitting: Family Medicine

## 2016-12-11 ENCOUNTER — Encounter (HOSPITAL_COMMUNITY): Payer: Self-pay | Admitting: Emergency Medicine

## 2016-12-11 DIAGNOSIS — S46811A Strain of other muscles, fascia and tendons at shoulder and upper arm level, right arm, initial encounter: Secondary | ICD-10-CM | POA: Diagnosis not present

## 2016-12-11 DIAGNOSIS — T148XXA Other injury of unspecified body region, initial encounter: Secondary | ICD-10-CM

## 2016-12-11 DIAGNOSIS — X503XXA Overexertion from repetitive movements, initial encounter: Secondary | ICD-10-CM

## 2016-12-11 MED ORDER — NAPROXEN 500 MG PO TBEC
500.0000 mg | DELAYED_RELEASE_TABLET | Freq: Two times a day (BID) | ORAL | 0 refills | Status: DC
Start: 2016-12-11 — End: 2018-05-15

## 2016-12-11 MED ORDER — DICLOFENAC SODIUM 1 % TD GEL: 1.0000 "application " | Freq: Four times a day (QID) | TRANSDERMAL | 0 refills | Status: DC

## 2016-12-11 NOTE — ED Triage Notes (Signed)
Pt c/o right arm and shoulder pain onset 2 days   Sts she works at [employer] and is constantly lifting heavy objects  Pain increases w/activity  A&O x4... NAD... Ambulatory

## 2016-12-11 NOTE — Discharge Instructions (Signed)
Apply heat to the muscles or most painful particularly the hour aspect of the right upper arm. Rest for the next 2-3 days. Wear the sling for only 8 hours during the day but remove the sling to move the shoulder around. Take the medications as directed, apply heat to help with muscle relaxation and circulation. When you go back to work consider using the ThermaCare wraps or any other heat application to the shoulder and arm muscles to help with muscle relaxation. Think about ways he can alter your type of work to take the pressure and forced off the right upper arm. Follow-up with your primary care doctor as needed

## 2016-12-11 NOTE — ED Provider Notes (Signed)
CSN: [id number]     Arrival date & time 12/11/16  1803 History   First MD Initiated Contact with Patient 12/11/16 1902     Chief Complaint  Patient presents with  . Shoulder Pain   (Consider location/radiation/quality/duration/timing/severity/associated sxs/prior Treatment) 31 year old female complaining of pain to the right upper outer arm, right trapezius and some shooting pains to the right forearm and wrist. This started shortly after obtaining a new job where she has to do a lot of lifting and repetitive motion of the arms and particularly the right arm. She has limited ability to abduct the right arm due to pain in the deltoid muscle. Denies any known injury, trauma. She admits that you have repetitive activity of her job is the cause. 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 This causes some spasms and freezing of the digits where it is difficult to flex and extend.      History reviewed. No pertinent past medical history. No past surgical history on file. History reviewed. No pertinent family history. Social History  Substance Use Topics  . Smoking status: Current Every Day Smoker    Types: Cigarettes  . Smokeless tobacco: Never Used  . Alcohol use No   OB History    No data available     Review of Systems  Constitutional: Negative for activity change, chills and fever.  HENT: Negative.   Respiratory: Negative.   Cardiovascular: Negative.   Musculoskeletal:       As per HPI  Skin: Negative for color change, pallor and rash.  Neurological: Negative.     Allergies  Patient has no known allergies.  Home Medications   Prior to Admission medications   Medication Sig Start Date End Date Taking? Authorizing Provider  clindamycin (CLEOCIN) 300 MG capsule Take 1 capsule (300 mg total) by mouth 4 (four) times daily. X 7 days 12/12/13   Ivonne Andrew, PA-C  diclofenac sodium (VOLTAREN) 1 % GEL Apply 1 application  topically 4 (four) times daily. 12/11/16   Hayden Rasmussen, NP  ibuprofen (ADVIL,MOTRIN) 200 MG tablet Take 400 mg by mouth every 6 (six) hours as needed for moderate pain.    [provider]  Ibuprofen-Diphenhydramine Cit (ADVIL PM PO) Take 2 tablets by mouth at bedtime as needed (pain/sleep).    [provider]  naproxen (EC-NAPROSYN) 500 MG EC tablet Take 1 tablet (500 mg total) by mouth 2 (two) times daily with a meal. 12/11/16   Hayden Rasmussen, NP   Meds Ordered and Administered this Visit  Medications - No data to display  BP 131/70 (BP Location: Left Arm)   Pulse 71   Temp 98.3 F (36.8 C) (Oral)   Resp 16   LMP 11/05/2016   SpO2 100%  No data found.   Physical Exam  Constitutional: She is oriented to person, place, and time. She appears well-developed and well-nourished. No distress.  HENT:  Head: Normocephalic and atraumatic.  Eyes: EOM are normal.  Neck: Normal range of motion. Neck supple.  Cardiovascular: Normal rate.   Pulmonary/Chest: Effort normal.  Musculoskeletal:  Mild tenderness to the trapezius and supraspinatus muscle on the right. Greater tenderness to the right deltoid muscle and lesser tenderness to the forearm. Patient is unable to abduct the right arm secondary to pain in the deltoid muscle. No shoulder joint tenderness. Passive range of motion is normal. Distal neurovascular motor sensory is grossly intact.  Neurological: She is alert and oriented  to person, place, and time. No cranial nerve deficit.  Skin: Skin is warm and dry.  Nursing note and vitals reviewed.   Urgent Care Course     Procedures (including critical care time)  Labs Review Labs Reviewed - No data to display  Imaging Review No results found.   Visual Acuity Review  Right Eye Distance:   Left Eye Distance:   Bilateral Distance:    Right Eye Near:   Left Eye Near:    Bilateral Near:         MDM   1. Strain of deltoid muscle, right, initial encounter   2.  Trapezius strain, right, initial encounter   3. Repetitive strain injury    Apply heat to the muscles or most painful particularly the hour aspect of the right upper arm. Rest for the next 2-3 days. Wear the sling for only 8 hours during the day but remove the sling to move the shoulder around. Take the medications as directed, apply heat to help with muscle relaxation and circulation. When you go back to work consider using the ThermaCare wraps or any other heat application to the shoulder and arm muscles to help with muscle relaxation. Think about ways he can alter your type of work to take the pressure and forced off the right upper arm. Follow-up with your primary care doctor as needed Meds ordered this encounter  Medications  . diclofenac sodium (VOLTAREN) 1 % GEL    Sig: Apply 1 application topically 4 (four) times daily.    Dispense:  100 g    Refill:  0    Order Specific Question:   Supervising Provider    Answer:   Elvina SidleLAUENSTEIN, KURT [[id number]]  . naproxen (EC-NAPROSYN) 500 MG EC tablet    Sig: Take 1 tablet (500 mg total) by mouth 2 (two) times daily with a meal.    Dispense:  24 tablet    Refill:  0    Order Specific Question:   Supervising Provider    Answer:   Maudry DiegoLAUENSTEIN, KURT [[id number]]       [name], NP 12/11/16 1926

## 2017-07-29 DIAGNOSIS — W57XXXA Bitten or stung by nonvenomous insect and other nonvenomous arthropods, initial encounter: Secondary | ICD-10-CM | POA: Insufficient documentation

## 2017-07-29 DIAGNOSIS — Y939 Activity, unspecified: Secondary | ICD-10-CM | POA: Diagnosis not present

## 2017-07-29 DIAGNOSIS — S60562A Insect bite (nonvenomous) of left hand, initial encounter: Secondary | ICD-10-CM | POA: Insufficient documentation

## 2017-07-29 DIAGNOSIS — J029 Acute pharyngitis, unspecified: Secondary | ICD-10-CM | POA: Insufficient documentation

## 2017-07-29 DIAGNOSIS — F17210 Nicotine dependence, cigarettes, uncomplicated: Secondary | ICD-10-CM | POA: Insufficient documentation

## 2017-07-29 DIAGNOSIS — S60561A Insect bite (nonvenomous) of right hand, initial encounter: Secondary | ICD-10-CM | POA: Insufficient documentation

## 2017-07-29 DIAGNOSIS — Y929 Unspecified place or not applicable: Secondary | ICD-10-CM | POA: Diagnosis not present

## 2017-07-29 DIAGNOSIS — Y999 Unspecified external cause status: Secondary | ICD-10-CM | POA: Insufficient documentation

## 2017-07-30 ENCOUNTER — Encounter (HOSPITAL_COMMUNITY): Payer: Self-pay

## 2017-07-30 ENCOUNTER — Emergency Department (HOSPITAL_COMMUNITY)
Admission: EM | Admit: 2017-07-30 | Discharge: 2017-07-30 | Disposition: A | Discharge status: Home / Self Care | Payer: Medicaid Other | Attending: Emergency Medicine | Admitting: Emergency Medicine

## 2017-07-30 DIAGNOSIS — W57XXXA Bitten or stung by nonvenomous insect and other nonvenomous arthropods, initial encounter: Secondary | ICD-10-CM

## 2017-07-30 DIAGNOSIS — J029 Acute pharyngitis, unspecified: Secondary | ICD-10-CM

## 2017-07-30 NOTE — ED Triage Notes (Signed)
Pt reports that her throat has been sore for about 4 days and has missed 2 days of work. She states that it hurts to swallow. Denies fever, nausea or vomiting. She also has what appears to be bug bites on her hands feet and stomach and wants some cream for them. A&Ox4.

## 2017-07-30 NOTE — ED Provider Notes (Signed)
[facility]-EMERGENCY DEPT Provider Note   CSN: [id number] Arrival date & time: 07/29/17  2341     History   Chief Complaint Chief Complaint  Patient presents with  . Sore Throat    HPI Sara Stevens is a 32 y.o. female.  No congestion, cough, fever.    The history is provided by the patient. No language interpreter was used.  Sore Throat  This is a new problem. The current episode started 2 days ago. The problem occurs constantly. The problem has not changed since onset.Pertinent negatives include no chest pain, no abdominal pain, no headaches and no shortness of breath. The symptoms are aggravated by swallowing. She has tried nothing for the symptoms.    History reviewed. No pertinent past medical history.  There are no active problems to display for this patient.   History reviewed. No pertinent surgical history.  OB History    No data available       Home Medications    Prior to Admission medications   Medication Sig Start Date End Date Taking? Authorizing Provider  clindamycin (CLEOCIN) 300 MG capsule Take 1 capsule (300 mg total) by mouth 4 (four) times daily. X 7 days 12/12/13   Ivonne Andrew, PA-C  diclofenac sodium (VOLTAREN) 1 % GEL Apply 1 application topically 4 (four) times daily. 12/11/16   Hayden Rasmussen, NP  ibuprofen (ADVIL,MOTRIN) 200 MG tablet Take 400 mg by mouth every 6 (six) hours as needed for moderate pain.    [provider]  Ibuprofen-Diphenhydramine Cit (ADVIL PM PO) Take 2 tablets by mouth at bedtime as needed (pain/sleep).    [provider]  naproxen (EC-NAPROSYN) 500 MG EC tablet Take 1 tablet (500 mg total) by mouth 2 (two) times daily with a meal. 12/11/16   Hayden Rasmussen, NP    Family History History reviewed. No pertinent family history.  Social History Social History   Tobacco Use  . Smoking status: Current Every Day Smoker    Types: Cigarettes  . Smokeless tobacco: Never Used  Substance Use  Topics  . Alcohol use: No  . Drug use: No     Allergies   Patient has no known allergies.   Review of Systems Review of Systems  Constitutional: Negative for fever.  HENT: Positive for sore throat. Negative for congestion and trouble swallowing.   Respiratory: Negative for cough and shortness of breath.   Cardiovascular: Negative for chest pain.  Gastrointestinal: Negative for abdominal pain and nausea.  Neurological: Negative for headaches.     Physical Exam Updated Vital Signs BP 127/80 (BP Location: Left Arm)   Pulse 71   Temp 99.1 F (37.3 C) (Oral)   Resp 16   SpO2 92%   Physical Exam  Constitutional: She is oriented to person, place, and time. She appears well-developed and well-nourished.  HENT:  Head: Normocephalic.  Mouth/Throat: Oropharynx is clear and moist and mucous membranes are normal. No uvula swelling. No oropharyngeal exudate.  Tonsils absent.  Neck: Normal range of motion.  Pulmonary/Chest: Effort normal.  Lymphadenopathy:    She has no cervical adenopathy.  Neurological: She is alert and oriented to person, place, and time.  Skin: Skin is warm and dry.  Multiple raised red bumps on dorsum of hands bilaterally. No pustules, blisters or bleeding.      ED Treatments / Results  Labs (all labs ordered are listed, but only abnormal results are displayed) Labs Reviewed - No data to display  EKG  EKG  Interpretation None       Radiology No results found.  Procedures Procedures (including critical care time)  Medications Ordered in ED Medications - No data to display   Initial Impression / Assessment and Plan / ED Course  I have reviewed the triage vital signs and the nursing notes.  Pertinent labs & imaging results that were available during my care of the patient were reviewed by me and considered in my medical decision making (see chart for details).     Patient presents with sore throat x 2 days. No fever, cough or congestion.  Exam is without abnormality. VSS. She has multiple insect bites to hands that appear c/w bed bugs. She can be discharged home recommending supportive care.   Final Clinical Impressions(s) / ED Diagnoses   Final diagnoses:  Pharyngitis, unspecified etiology  Bedbug bite, initial encounter    ED Discharge Orders    None       Elpidio AnisUpstill, Author [place], PA-C 07/30/17 [id number]    Geoffery Lyonselo, [place], MD 07/30/17 [phone]

## 2018-05-15 ENCOUNTER — Other Ambulatory Visit: Payer: Self-pay

## 2018-05-15 ENCOUNTER — Ambulatory Visit (HOSPITAL_COMMUNITY)
Admission: EM | Admit: 2018-05-15 | Discharge: 2018-05-15 | Disposition: A | Discharge status: Home / Self Care | Payer: Medicaid Other | Attending: Family Medicine | Admitting: Family Medicine

## 2018-05-15 ENCOUNTER — Encounter (HOSPITAL_COMMUNITY): Payer: Self-pay | Admitting: Emergency Medicine

## 2018-05-15 DIAGNOSIS — J0100 Acute maxillary sinusitis, unspecified: Secondary | ICD-10-CM | POA: Diagnosis not present

## 2018-05-15 MED ORDER — AMOXICILLIN-POT CLAVULANATE 875-125 MG PO TABS: 1.0000 | ORAL_TABLET | Freq: Two times a day (BID) | ORAL | 0 refills | Status: AC

## 2018-05-15 NOTE — ED Provider Notes (Signed)
[facility]   [id number] 05/15/18 Arrival Time: 1000  Cc: URI symptoms  SUBJECTIVE:  Sara Stevens is a 32 y.o. female who presents with nasal congestion, runny nose, sinus pressure and pain, and cough x 1 week.  Denies positive sick exposure or precipitating event.  Describes cough as intermittent and productive with thick dark green sputum.  Has tried tylenol without relief.  Denies aggravating factors.  Denies previous symptoms in the past.  Complains of associated fatigue, and sore throat. Denies fever, chills, SOB, wheezing, chest pain, nausea, changes in bowel or bladder habits.    ROS: As per HPI.  History reviewed. No pertinent past medical history. Past Surgical History:  Procedure Laterality Date  . CESAREAN SECTION    . TUBAL LIGATION     No Known Allergies No current facility-administered medications on file prior to encounter.    Current Outpatient Medications on File Prior to Encounter  Medication Sig Dispense Refill  . ibuprofen (ADVIL,MOTRIN) 200 MG tablet Take 400 mg by mouth every 6 (six) hours as needed for moderate pain.    . Ibuprofen-Diphenhydramine Cit (ADVIL PM PO) Take 2 tablets by mouth at bedtime as needed (pain/sleep).      Social History   Socioeconomic History  . Marital status: Single    Spouse name: Not on file  . Number of children: Not on file  . Years of education: Not on file  . Highest education level: Not on file  Occupational History  . Not on file  Social Needs  . Financial resource strain: Not on file  . Food insecurity:    Worry: Not on file    Inability: Not on file  . Transportation needs:    Medical: Not on file    Non-medical: Not on file  Tobacco Use  . Smoking status: Current Every Day Smoker    Types: Cigarettes  . Smokeless tobacco: Never Used  Substance and Sexual Activity  . Alcohol use: No  . Drug use: No  . Sexual activity: Never    Birth control/protection: Surgical  Lifestyle  . Physical  activity:    Days per week: Not on file    Minutes per session: Not on file  . Stress: Not on file  Relationships  . Social connections:    Talks on phone: Not on file    Gets together: Not on file    Attends religious service: Not on file    Active member of club or organization: Not on file    Attends meetings of clubs or organizations: Not on file    Relationship status: Not on file  . Intimate partner violence:    Fear of current or ex partner: Not on file    Emotionally abused: Not on file    Physically abused: Not on file    Forced sexual activity: Not on file  Other Topics Concern  . Not on file  Social History Narrative  . Not on file   Family History  Problem Relation Age of Onset  . Healthy Mother   . Healthy Father      OBJECTIVE:  Vitals:   05/15/18 1101  BP: (!) 109/58  Pulse: 69  Resp: 18  Temp: 97.8 F (36.6 C)  TempSrc: Oral  SpO2: 98%     General appearance: Alert, appears fatigued, but nontoxic; speaking in full sentences without difficulty HEENT:NCAT; Ears: EACs clear, TMs pearly gray; Eyes: PERRL.  EOM grossly intact. Sinus: Maxillary sinus tenderness; Nose: nares patent  without rhinorrhea; Throat: tonsils nonerythematous or enlarged, uvula midline  Neck: supple without LAD Lungs: clear to auscultation bilaterally without adventitious breath sounds; normal respiratory effort; mild cough present Heart: regular rate and rhythm.  Radial pulses 2+ symmetrical bilaterally Skin: warm and dry Psychological: alert and cooperative; normal mood and affect   ASSESSMENT & PLAN:  1. Acute non-recurrent maxillary sinusitis     Meds ordered this encounter  Medications  . amoxicillin-clavulanate (AUGMENTIN) 875-125 MG tablet    Sig: Take 1 tablet by mouth every 12 (twelve) hours for 10 days.    Dispense:  20 tablet    Refill:  0    Order Specific Question:   Supervising Provider    Answer:   Isa RankinMURRAY, LAURA WILSON [[id number]]   Rest and push fluids Use  may use OTC Zyrtec D  Augmentin prescribed.  Take as directed and to completion Continue with OTC ibuprofen/tylenol as needed for pain Follow up with PCP or [facility] if symptoms persists Return or go to the ED if you have any new or worsening symptoms such as fever, chills, worsening sinus pain/pressure, cough, sore throat, chest pain, shortness of breath, abdominal pain, changes in bowel or bladder habits, etc...  Reviewed expectations re: course of current medical issues. Questions answered. Outlined signs and symptoms indicating need for more acute intervention. Patient verbalized understanding. After Visit Summary given.          Rennis HardingWurst, Sanna Porcaro, PA-C 05/15/18 1147

## 2018-05-15 NOTE — Discharge Instructions (Signed)
Rest and push fluids Use may use OTC Zyrtec D  Augmentin prescribed.  Take as directed and to completion Continue with OTC ibuprofen/tylenol as needed for pain Follow up with PCP or [facility] if symptoms persists Return or go to the ED if you have any new or worsening symptoms such as fever, chills, worsening sinus pain/pressure, cough, sore throat, chest pain, shortness of breath, abdominal pain, changes in bowel or bladder habits, etc...Marland Kitchen

## 2018-05-15 NOTE — ED Triage Notes (Addendum)
Patient has cough.  Patient has chest congestion.  No fever.  Phlegm is thick, dark, green.  Nasal congestion and itchy in chest

## 2019-05-29 ENCOUNTER — Other Ambulatory Visit: Payer: Self-pay

## 2019-05-29 DIAGNOSIS — Z20822 Contact with and (suspected) exposure to covid-19: Secondary | ICD-10-CM

## 2019-06-01 LAB — NOVEL CORONAVIRUS, NAA: SARS-CoV-2, NAA: NOT DETECTED

## 2019-06-10 ENCOUNTER — Telehealth: Payer: Medicaid Other | Admitting: Nurse Practitioner

## 2019-06-10 DIAGNOSIS — Z20822 Contact with and (suspected) exposure to covid-19: Secondary | ICD-10-CM

## 2019-06-10 DIAGNOSIS — R0602 Shortness of breath: Secondary | ICD-10-CM

## 2019-06-10 NOTE — Progress Notes (Signed)
E-Visit for Corona Virus Screening   Your current symptoms could be consistent with the coronavirus.  Many health care providers can now test patients at their office but not all are.  [facility] has multiple testing sites. For information on our COVID testing locations and hours go to [email or web address]   Testing Information: The COVID-19 Community Testing sites will begin testing BY APPOINTMENT ONLY starting the week of December 15th and December 16th (see go-live dates by location below).  You can begin scheduling online at [email or web address]  If you do not have access to a smart phone or computer you may call [phone] for an appointment. . In addition, starting the week of December 14th we will move INDOORS for testing (see locations below). . Community testing site appointment hours will be 8 a.m. to 3:30 p.m.   Testing Locations: [facility] Appointments will begin Tuesday December 15th. Closed on Monday December 14th to relocate indoors to [address], [place] [address] [facility] Appointments will begin Wednesday December 16th. Closed on Tuesday December 15th for move to indoors at [facility] [address] [place], [place] [address] [facility] Appointments will begin Wednesday December 16th. Closed on Tuesday December 15th for move to indoors at [address] [place] [address]   We are enrolling you in our MyChart Home Monitoring for COVID19 . Daily you will receive a questionnaire within the MyChart website. Our COVID 19 response team will be monitoring your responses daily.  Please quarantine yourself while awaiting your test results. If you develop fever/cough/breathlessness, please stay home for 10 days with improving symptoms and until you have had 24 hours of no fever (without taking a fever reducer).  You should wear a mask or cloth face covering over your nose and mouth if you must be around other people or animals, including pets (even at  home). Try to stay at least 6 feet away from other people. This will protect the people around you.  Please continue good preventive care measures, including:  frequent hand-washing, avoid touching your face, cover coughs/sneezes, stay out of crowds and keep a 6 foot distance from others.  COVID-19 is a respiratory illness with symptoms that are similar to the flu. Symptoms are typically mild to moderate, but there have been cases of severe illness and death due to the virus.   The following symptoms may appear 2-14 days after exposure: . Fever . Cough . Shortness of breath or difficulty breathing . Chills . Repeated shaking with chills . Muscle pain . Headache . Sore throat . New loss of taste or smell . Fatigue . Congestion or runny nose . Nausea or vomiting . Diarrhea  Go to the nearest hospital ED for assessment if fever/cough/breathlessness are severe or illness seems like a threat to life.  It is vitally important that if you feel that you have an infection such as this virus or any other virus that you stay home and away from places where you may spread it to others.  You should avoid contact with people age 59 and older.   You can use medication such as delsym OTC if develop a cougH   You may also take acetaminophen (Tylenol) as needed for fever.  Reduce your risk of any infection by using the same precautions used for avoiding the common cold or flu:  Marland Kitchen Wash your hands often with soap and warm water for at least 20 seconds.  If soap and water are not readily available, use  an alcohol-based hand sanitizer with at least 60% alcohol.  . If coughing or sneezing, cover your mouth and nose by coughing or sneezing into the elbow areas of your shirt or coat, into a tissue or into your sleeve (not your hands). . Avoid shaking hands with others and consider head nods or verbal greetings only. . Avoid touching your eyes, nose, or mouth with unwashed hands.  . Avoid close contact with  people who are sick. . Avoid places or events with large numbers of people in one location, like concerts or sporting events. . Carefully consider travel plans you have or are making. . If you are planning any travel outside or inside the [place], visit the CDC's Travelers' Health webpage for the latest health notices. . If you have some symptoms but not all symptoms, continue to monitor at home and seek medical attention if your symptoms worsen. . If you are having a medical emergency, call 911.  HOME CARE . Only take medications as instructed by your medical team. . Drink plenty of fluids and get plenty of rest. . A steam or ultrasonic humidifier can help if you have congestion.   GET HELP RIGHT AWAY IF YOU HAVE EMERGENCY WARNING SIGNS** FOR COVID-19. If you or someone is showing any of these signs seek emergency medical care immediately. Call 911 or proceed to your closest emergency facility if: . You develop worsening high fever. . Trouble breathing . Bluish lips or face . Persistent pain or pressure in the chest . New confusion . Inability to wake or stay awake . You cough up blood. . Your symptoms become more severe  **This list is not all possible symptoms. Contact your medical provider for any symptoms that are sever or concerning to you.  MAKE SURE YOU   Understand these instructions.  Will watch your condition.  Will get help right away if you are not doing well or get worse.  Your e-visit answers were reviewed by a board certified advanced clinical practitioner to complete your personal care plan.  Depending on the condition, your plan could have included both over the counter or prescription medications.  If there is a problem please reply once you have received a response from your provider.  Your safety is important to [place].  If you have drug allergies check your prescription carefully.    You can use MyChart to ask questions about today's visit, request a non-urgent call  back, or ask for a work or school excuse for 24 hours related to this e-Visit. If it has been greater than 24 hours you will need to follow up with your provider, or enter a new e-Visit to address those concerns. You will get an e-mail in the next two days asking about your experience.  I hope that your e-visit has been valuable and will speed your recovery. Thank you for using e-visits.   5-10 minutes spent reviewing and documenting in chart.

## 2019-06-11 ENCOUNTER — Other Ambulatory Visit: Payer: Self-pay

## 2019-06-11 ENCOUNTER — Ambulatory Visit: Payer: Medicaid Other | Attending: Internal Medicine

## 2019-06-11 DIAGNOSIS — Z20822 Contact with and (suspected) exposure to covid-19: Secondary | ICD-10-CM

## 2019-06-12 LAB — NOVEL CORONAVIRUS, NAA: SARS-CoV-2, NAA: NOT DETECTED

## 2022-09-04 ENCOUNTER — Emergency Department (HOSPITAL_BASED_OUTPATIENT_CLINIC_OR_DEPARTMENT_OTHER): Payer: Medicaid Other

## 2022-09-04 ENCOUNTER — Inpatient Hospital Stay (HOSPITAL_BASED_OUTPATIENT_CLINIC_OR_DEPARTMENT_OTHER)
Admission: EM | Admit: 2022-09-04 | Discharge: 2022-09-07 | DRG: 759 | Disposition: A | Discharge status: Home / Self Care | Payer: Medicaid Other | Attending: Obstetrics and Gynecology | Admitting: Obstetrics and Gynecology

## 2022-09-04 ENCOUNTER — Encounter (HOSPITAL_BASED_OUTPATIENT_CLINIC_OR_DEPARTMENT_OTHER): Payer: Self-pay

## 2022-09-04 ENCOUNTER — Other Ambulatory Visit: Payer: Self-pay

## 2022-09-04 DIAGNOSIS — R7303 Prediabetes: Secondary | ICD-10-CM | POA: Diagnosis present

## 2022-09-04 DIAGNOSIS — F17210 Nicotine dependence, cigarettes, uncomplicated: Secondary | ICD-10-CM | POA: Diagnosis present

## 2022-09-04 DIAGNOSIS — N7093 Salpingitis and oophoritis, unspecified: Secondary | ICD-10-CM | POA: Diagnosis present

## 2022-09-04 DIAGNOSIS — R1084 Generalized abdominal pain: Principal | ICD-10-CM

## 2022-09-04 DIAGNOSIS — Z720 Tobacco use: Secondary | ICD-10-CM | POA: Insufficient documentation

## 2022-09-04 DIAGNOSIS — D509 Iron deficiency anemia, unspecified: Secondary | ICD-10-CM | POA: Diagnosis present

## 2022-09-04 DIAGNOSIS — R739 Hyperglycemia, unspecified: Secondary | ICD-10-CM | POA: Diagnosis present

## 2022-09-04 DIAGNOSIS — A599 Trichomoniasis, unspecified: Secondary | ICD-10-CM | POA: Insufficient documentation

## 2022-09-04 LAB — CBC
HCT: 31.5 % — ABNORMAL LOW (ref 36.0–46.0)
Hemoglobin: 9.4 g/dL — ABNORMAL LOW (ref 12.0–15.0)
MCH: 21.7 pg — ABNORMAL LOW (ref 26.0–34.0)
MCHC: 29.8 g/dL — ABNORMAL LOW (ref 30.0–36.0)
MCV: 72.7 fL — ABNORMAL LOW (ref 80.0–100.0)
Platelets: 385 10*3/uL (ref 150–400)
RBC: 4.33 MIL/uL (ref 3.87–5.11)
RDW: 17.2 % — ABNORMAL HIGH (ref 11.5–15.5)
WBC: 12.4 10*3/uL — ABNORMAL HIGH (ref 4.0–10.5)
nRBC: 0 % (ref 0.0–0.2)

## 2022-09-04 LAB — LIPASE, BLOOD: Lipase: 19 U/L (ref 11–51)

## 2022-09-04 LAB — COMPREHENSIVE METABOLIC PANEL
ALT: 10 U/L (ref 0–44)
AST: 9 U/L — ABNORMAL LOW (ref 15–41)
Albumin: 4 g/dL (ref 3.5–5.0)
Alkaline Phosphatase: 53 U/L (ref 38–126)
Anion gap: 11 (ref 5–15)
BUN: 7 mg/dL (ref 6–20)
CO2: 27 mmol/L (ref 22–32)
Calcium: 9.3 mg/dL (ref 8.9–10.3)
Chloride: 100 mmol/L (ref 98–111)
Creatinine, Ser: 0.84 mg/dL (ref 0.44–1.00)
GFR, Estimated: >60 mL/min (ref >60)
Glucose, Bld: 147 mg/dL — ABNORMAL HIGH (ref 70–99)
Potassium: 3.6 mmol/L (ref 3.5–5.1)
Sodium: 138 mmol/L (ref 135–145)
Total Bilirubin: 0.4 mg/dL (ref 0.3–1.2)
Total Protein: 7.7 g/dL (ref 6.5–8.1)

## 2022-09-04 LAB — LACTIC ACID, PLASMA
Lactic Acid, Venous: 1.6 mmol/L (ref 0.5–1.9)
Lactic Acid, Venous: 1.1 mmol/L (ref 0.5–1.9)

## 2022-09-04 LAB — WET PREP, GENITAL
Clue Cells Wet Prep HPF POC: NONE SEEN
Sperm: NONE SEEN
Trich, Wet Prep: NONE SEEN
WBC, Wet Prep HPF POC: <10 (ref <10)
Yeast Wet Prep HPF POC: NONE SEEN

## 2022-09-04 LAB — URINALYSIS, ROUTINE W REFLEX MICROSCOPIC
Bilirubin Urine: NEGATIVE
Glucose, UA: NEGATIVE mg/dL
Hgb urine dipstick: NEGATIVE
Ketones, ur: 15 mg/dL — AB
Leukocytes,Ua: NEGATIVE
Nitrite: NEGATIVE
Protein, ur: NEGATIVE mg/dL
Specific Gravity, Urine: 1.01 (ref 1.005–1.030)
pH: 5.5 (ref 5.0–8.0)

## 2022-09-04 LAB — PREGNANCY, URINE: Preg Test, Ur: NEGATIVE

## 2022-09-04 MED ORDER — ONDANSETRON HCL 4 MG/2ML IJ SOLN
4.0000 mg | Freq: Once | INTRAMUSCULAR | Status: AC
  Administered 2022-09-04: 4 mg via INTRAVENOUS
  Filled 2022-09-04: qty 2

## 2022-09-04 MED ORDER — ACETAMINOPHEN 325 MG PO TABS
650.0000 mg | ORAL_TABLET | Freq: Once | ORAL | Status: AC
  Administered 2022-09-04: 650 mg via ORAL
  Filled 2022-09-04: qty 2

## 2022-09-04 MED ORDER — SODIUM CHLORIDE 0.9 % IV SOLN
3.0000 g | Freq: Four times a day (QID) | INTRAVENOUS | Status: DC
  Administered 2022-09-04: 3 g via INTRAVENOUS

## 2022-09-04 MED ORDER — SODIUM CHLORIDE 0.9 % IV BOLUS
1000.0000 mL | Freq: Once | INTRAVENOUS | Status: AC
  Administered 2022-09-04: 1000 mL via INTRAVENOUS

## 2022-09-04 MED ORDER — ONDANSETRON HCL 4 MG/2ML IJ SOLN: 4.0000 mg | Freq: Four times a day (QID) | INTRAMUSCULAR | Status: DC | PRN

## 2022-09-04 MED ORDER — POLYETHYLENE GLYCOL 3350 17 G PO PACK
17.0000 g | PACK | Freq: Every day | ORAL | Status: DC | PRN
  Administered 2022-09-04: 17 g via ORAL
  Filled 2022-09-04: qty 1

## 2022-09-04 MED ORDER — ONDANSETRON HCL 4 MG PO TABS
4.0000 mg | ORAL_TABLET | Freq: Four times a day (QID) | ORAL | Status: DC | PRN
  Administered 2022-09-05 (×2): 4 mg via ORAL
  Filled 2022-09-04 (×2): qty 1

## 2022-09-04 MED ORDER — MENTHOL 3 MG MT LOZG: 1.0000 | LOZENGE | OROMUCOSAL | Status: DC | PRN

## 2022-09-04 MED ORDER — SODIUM CHLORIDE 0.9 % IV SOLN
1.0000 g | INTRAVENOUS | Status: DC
  Administered 2022-09-04 – 2022-09-06 (×3): 1 g via INTRAVENOUS
  Filled 2022-09-04 (×5): qty 10

## 2022-09-04 MED ORDER — IBUPROFEN 600 MG PO TABS
600.0000 mg | ORAL_TABLET | Freq: Four times a day (QID) | ORAL | Status: DC | PRN
  Administered 2022-09-05 – 2022-09-06 (×4): 600 mg via ORAL
  Filled 2022-09-04 (×4): qty 1

## 2022-09-04 MED ORDER — METRONIDAZOLE 500 MG PO TABS
500.0000 mg | ORAL_TABLET | Freq: Two times a day (BID) | ORAL | Status: DC
  Administered 2022-09-04 – 2022-09-07 (×6): 500 mg via ORAL
  Filled 2022-09-04 (×6): qty 1

## 2022-09-04 MED ORDER — SODIUM CHLORIDE 0.9 % IV SOLN
100.0000 mg | Freq: Two times a day (BID) | INTRAVENOUS | Status: DC
  Administered 2022-09-04 – 2022-09-07 (×6): 100 mg via INTRAVENOUS
  Filled 2022-09-04 (×8): qty 100

## 2022-09-04 MED ORDER — GUAIFENESIN 100 MG/5ML PO LIQD: 15.0000 mL | ORAL | Status: DC | PRN

## 2022-09-04 MED ORDER — MORPHINE SULFATE (PF) 4 MG/ML IV SOLN
4.0000 mg | Freq: Once | INTRAVENOUS | Status: AC
  Administered 2022-09-04: 4 mg via INTRAVENOUS
  Filled 2022-09-04: qty 1

## 2022-09-04 MED ORDER — IOHEXOL 300 MG/ML  SOLN
100.0000 mL | Freq: Once | INTRAMUSCULAR | Status: AC | PRN
  Administered 2022-09-04: 100 mL via INTRAVENOUS

## 2022-09-04 MED ORDER — ENOXAPARIN SODIUM 40 MG/0.4ML IJ SOSY
40.0000 mg | PREFILLED_SYRINGE | INTRAMUSCULAR | Status: DC
  Administered 2022-09-04 – 2022-09-06 (×3): 40 mg via SUBCUTANEOUS
  Filled 2022-09-04 (×3): qty 0.4

## 2022-09-04 MED ORDER — LACTATED RINGERS IV SOLN: INTRAVENOUS | Status: DC

## 2022-09-04 MED ORDER — SODIUM CHLORIDE 0.9 % IV SOLN: INTRAVENOUS | Status: DC

## 2022-09-04 MED ORDER — OXYCODONE-ACETAMINOPHEN 5-325 MG PO TABS
1.0000 | ORAL_TABLET | ORAL | Status: DC | PRN
  Administered 2022-09-04 – 2022-09-05 (×2): 1 via ORAL
  Administered 2022-09-05 (×2): 2 via ORAL
  Administered 2022-09-05 – 2022-09-06 (×2): 1 via ORAL
  Filled 2022-09-04 (×4): qty 1
  Filled 2022-09-04 (×2): qty 2

## 2022-09-04 MED ORDER — ALUM & MAG HYDROXIDE-SIMETH 200-200-20 MG/5ML PO SUSP: 30.0000 mL | ORAL | Status: DC | PRN

## 2022-09-04 MED ORDER — HYDROMORPHONE HCL 1 MG/ML IJ SOLN: 0.2000 mg | INTRAMUSCULAR | Status: DC | PRN

## 2022-09-04 NOTE — ED Notes (Signed)
Back fromn CT

## 2022-09-04 NOTE — ED Notes (Signed)
Report given to [facility] RN @ [facility]

## 2022-09-04 NOTE — ED Notes (Signed)
ED Provider at bedside. 

## 2022-09-04 NOTE — ED Notes (Signed)
Called [facility] for Transport talked to [place] at [employer]

## 2022-09-04 NOTE — ED Notes (Signed)
Report given to [facility]. 

## 2022-09-04 NOTE — ED Provider Notes (Signed)
[facility] [place] Provider Note   CSN: [id number] Arrival date & time: 09/04/22  [id number]     History  Chief Complaint  Patient presents with   Abdominal Pain    Sara Stevens is a 37 y.o. female.  The history is provided by the patient and medical records. No language interpreter was used.  Abdominal Pain Pain location:  Generalized Pain quality: aching and sharp   Pain radiates to:  Does not radiate Pain severity:  Severe Onset quality:  Gradual Duration:  2 days Timing:  Constant Progression:  Worsening Chronicity:  New Relieved by:  Nothing Worsened by:  Palpation and position changes Ineffective treatments:  None tried Associated symptoms: chills, fatigue, nausea, vaginal discharge and vomiting   Associated symptoms: no chest pain, no constipation, no cough, no diarrhea, no dysuria, no shortness of breath and no vaginal bleeding        Home Medications Prior to Admission medications   Medication Sig Start Date End Date Taking? Authorizing Provider  ibuprofen (ADVIL,MOTRIN) 200 MG tablet Take 400 mg by mouth every 6 (six) hours as needed for moderate pain.    [provider]  Ibuprofen-Diphenhydramine Cit (ADVIL PM PO) Take 2 tablets by mouth at bedtime as needed (pain/sleep).    [provider]      Allergies    Patient has no known allergies.    Review of Systems   Review of Systems  Constitutional:  Positive for chills and fatigue. Negative for diaphoresis.  HENT:  Negative for congestion.   Respiratory:  Negative for cough, chest tightness, shortness of breath and wheezing.   Cardiovascular:  Negative for chest pain and palpitations.  Gastrointestinal:  Positive for abdominal pain, nausea and vomiting. Negative for constipation and diarrhea.  Genitourinary:  Positive for pelvic pain and vaginal discharge. Negative for dysuria, flank pain, frequency, vaginal bleeding and vaginal pain.   Musculoskeletal:  Negative for back pain and neck pain.  Skin:  Negative for rash.  Neurological:  Negative for numbness and headaches.  Psychiatric/Behavioral:  Negative for agitation and confusion.   All other systems reviewed and are negative.   Physical Exam Updated Vital Signs Ht 5' (1.524 m)   Wt 88.5 kg   BMI 38.08 kg/m  Physical Exam Vitals and nursing note reviewed.  Constitutional:      General: She is not in acute distress.    Appearance: She is well-developed. She is ill-appearing. She is not toxic-appearing or diaphoretic.  HENT:     Head: Normocephalic and atraumatic.  Eyes:     Conjunctiva/sclera: Conjunctivae normal.  Cardiovascular:     Rate and Rhythm: Regular rhythm. Tachycardia present.     Heart sounds: No murmur heard. Pulmonary:     Effort: Pulmonary effort is normal. No respiratory distress.     Breath sounds: Normal breath sounds.  Abdominal:     General: Bowel sounds are normal. There is no distension.     Palpations: Abdomen is soft.     Tenderness: There is abdominal tenderness in the right lower quadrant, suprapubic area and left lower quadrant. There is no right CVA tenderness or left CVA tenderness.  Genitourinary:    Vagina: Vaginal discharge present.     Cervix: Discharge present.     Adnexa:        Right: No tenderness.         Left: No tenderness.    Musculoskeletal:        General: No  swelling.     Cervical back: Neck supple.  Skin:    General: Skin is warm and dry.     Capillary Refill: Capillary refill takes less than 2 seconds.     Coloration: Skin is not pale.     Findings: No rash.  Neurological:     General: No focal deficit present.     Mental Status: She is alert.  Psychiatric:        Mood and Affect: Mood normal.     ED Results / Procedures / Treatments   Labs (all labs ordered are listed, but only abnormal results are displayed) Labs Reviewed  COMPREHENSIVE METABOLIC PANEL - Abnormal; Notable for the following  components:      Result Value   Glucose, Bld 147 (*)    AST 9 (*)    All other components within normal limits  CBC - Abnormal; Notable for the following components:   WBC 12.4 (*)    Hemoglobin 9.4 (*)    HCT 31.5 (*)    MCV 72.7 (*)    MCH 21.7 (*)    MCHC 29.8 (*)    RDW 17.2 (*)    All other components within normal limits  URINALYSIS, ROUTINE W REFLEX MICROSCOPIC - Abnormal; Notable for the following components:   Ketones, ur 15 (*)    All other components within normal limits  WET PREP, GENITAL  CULTURE, BLOOD (ROUTINE X 2)  CULTURE, BLOOD (ROUTINE X 2)  LIPASE, BLOOD  PREGNANCY, URINE  LACTIC ACID, PLASMA  LACTIC ACID, PLASMA  GC/CHLAMYDIA PROBE AMP ([place]) NOT AT [facility]    EKG None  Radiology US PELVIC COMPLETE W TRANSVAGINAL AND TORSION R/O  Result Date: 09/04/2022 CLINICAL DATA:  Further evaluation of abnormal CT from same day. Pelvic pain. EXAM: TRANSABDOMINAL AND TRANSVAGINAL ULTRASOUND OF PELVIS DOPPLER ULTRASOUND OF OVARIES TECHNIQUE: Both transabdominal and transvaginal ultrasound examinations of the pelvis were performed. Transabdominal technique was performed for global imaging of the pelvis including uterus, ovaries, adnexal regions, and pelvic cul-de-sac. It was necessary to proceed with endovaginal exam following the transabdominal exam to visualize the uterus, endometrium, ovaries and adnexa. Color and duplex Doppler ultrasound was utilized to evaluate blood flow to the ovaries. COMPARISON:  Same day CT of the abdomen pelvis FINDINGS: Uterus Measurements: 11.1 x 6.6 x 5.2 cm = volume: 200 mL. Nabothian cysts in the cervix. Endometrium Thickness: 4.3.  No focal abnormality visualized. Right ovary Measurements: 3.6 x 2.0 x 1.9 cm = volume: 7.2 mL. Normal appearance/no adnexal mass. Left ovary Heterogeneous mixed cystic and solid lesion in the left adnexa. Pulsed Doppler evaluation of both ovaries demonstrates normal low-resistance arterial and venous waveforms.  Other findings Trace pelvic free fluid. IMPRESSION: Heterogeneous mixed cystic and solid lesion in the left adnexa, corresponding to the abnormality seen on CT. Findings are indeterminate with differential considerations primarily including tubo-ovarian abscess, hemorrhagic cyst, or ovarian neoplasm. Suggest further evaluation with physical examination and laboratory values with gynecologic consultation recommended. If clinical suspicion is high for tubo-ovarian abscess or hemorrhagic cyst would suggest a follow-up ultrasound in 6-8 weeks to ensure resolution. However, if clinical history is more indicative of a neoplastic process would suggest further evaluation by nonemergent pelvic ultrasound with and without intravascular contrast. Electronically Signed   By: Dahlia Bailiff M.D.   On: 09/04/2022 13:02   CT ABDOMEN PELVIS W CONTRAST  Result Date: 09/04/2022 CLINICAL DATA:  Constipation, nausea, vomiting, abdominal pain and distension. Mid lower abdominal pain for few days.  EXAM: CT ABDOMEN AND PELVIS WITH CONTRAST TECHNIQUE: Multidetector CT imaging of the abdomen and pelvis was performed using the standard protocol following bolus administration of intravenous contrast. RADIATION DOSE REDUCTION: This exam was performed according to the departmental dose-optimization program which includes automated exposure control, adjustment of the mA and/or kV according to patient size and/or use of iterative reconstruction technique. CONTRAST:  146m OMNIPAQUE IOHEXOL 300 MG/ML  SOLN COMPARISON:  None Available. FINDINGS: Lower chest: Lung bases are clear. Heart is at the upper limits of normal in size. No pericardial or pleural effusion. Distal esophagus is grossly unremarkable. Hepatobiliary: Liver is enlarged, 21.3 cm. Liver and gallbladder are otherwise unremarkable. No biliary ductal dilatation. Pancreas: Negative. Spleen: Negative. Adrenals/Urinary Tract: Adrenal glands and kidneys are unremarkable. Ureters are  decompressed. Bladder is grossly unremarkable. Stomach/Bowel: Stomach, small bowel, appendix and colon are unremarkable. Vascular/Lymphatic: Vascular structures are unremarkable. No pathologically enlarged lymph nodes. Reproductive: Complex heterogeneous left adnexal mass measures 5.1 x 5.9 cm (2/60). Stranding in the surrounding fat. Simple appearing 2.4 cm right ovarian cyst. Other: Small pelvic free fluid.  Trace perisplenic fluid. Musculoskeletal: None. IMPRESSION: 1. Complex left adnexal mass may represent a hemorrhagic left ovarian cyst. Malignancy cannot be excluded. The possibility of an associated hydro- or pyosalpinx on the left is considered. Pelvic ultrasound is recommended in further evaluation. 2. Small ascites. 3. Hepatomegaly. Electronically Signed   By: MLorin PicketM.D.   On: 09/04/2022 09:55    Procedures Procedures    CRITICAL CARE Performed by: CGwenyth AllegraTegeler Total critical care time: 35 minutes Critical care time was exclusive of separately billable procedures and treating other patients. Critical care was necessary to treat or prevent imminent or life-threatening deterioration. Critical care was time spent personally by me on the following activities: development of treatment plan with patient and/or surrogate as well as nursing, discussions with consultants, evaluation of patient's response to treatment, examination of patient, obtaining history from patient or surrogate, ordering and performing treatments and interventions, ordering and review of laboratory studies, ordering and review of radiographic studies, pulse oximetry and re-evaluation of patient's condition.  Medications Ordered in ED Medications  Ampicillin-Sulbactam (UNASYN) 3 g in sodium chloride 0.9 % 100 mL IVPB (has no administration in time range)  morphine (PF) 4 MG/ML injection 4 mg (4 mg Intravenous Given 09/04/22 0842)  ondansetron (ZOFRAN) injection 4 mg (4 mg Intravenous Given 09/04/22 0841)   sodium chloride 0.9 % bolus 1,000 mL ( Intravenous Stopped 09/04/22 0958)  iohexol (OMNIPAQUE) 300 MG/ML solution 100 mL (100 mLs Intravenous Contrast Given 09/04/22 0929)  morphine (PF) 4 MG/ML injection 4 mg (4 mg Intravenous Given 09/04/22 1203)  acetaminophen (TYLENOL) tablet 650 mg (650 mg Oral Given 09/04/22 1305)    ED Course/ Medical Decision Making/ A&P                             Medical Decision Making Amount and/or Complexity of Data Reviewed Labs: ordered. Radiology: ordered.  Risk OTC drugs. Prescription drug management. Decision regarding hospitalization.    Sara MARTINELLis a 37y.o. female with a past medical history significant for times C-sections, previous tubal ligation, and just completed her menstrual cycle several days ago who presents with severe abdominal pain with nausea and vomiting and decreased bowel movement.  According to patient, she did not have a bowel movement several days and has had severe abdominal pain.  She reports she has had some nausea  and vomiting today and is having severe, 10 out of 10 pain diffusely.  She feels her abdomen is distended and she is burping.  She denies any history of this.  Denies history of appendicitis, gallbladder disease, or diverticulitis.  No history of obstructions.  She reports she feels like something is rupturing or obstructed.  She denies trauma.  Denies any fevers or chills.  Denies congestion, cough, chest pain, shortness of breath or URI symptoms.  Denies back or flank pain.  Denies urinary changes.  Denies vaginal complaints.  Patient reports she does drink alcohol but has not had anything to drink during her menstrual cycle so no alcohol in the last week.  She also reports she does smoke marijuana but has not been smoking recently.  Denies other complaints.  On exam, abdomen is diffusely tender and patient is moaning in pain.  I did appreciate some bowel sounds but patient was hard to evaluate due to the moaning  in pain.  Back nontender.  Flanks nontender.  Lungs clear.  No murmur.  Good pulses in extremities.  Patient uncomfortable.  She feels she is bloated.  Clinically I do feel need to get a CT scan to rule out appendicitis, diverticulitis, bowel obstruction, or other cause of symptoms.  Will get screening labs.  Will give her the pain medicine, nausea medicine, fluids and she will remain NPO.  Anticipate reassessment after workup to determine disposition.  11:51 AM CT scan returned showing evidence of left adnexal mass that could be malignancy versus infection versus hemorrhagic cyst.  Ultrasound was recommended.  We did a pelvic exam with a chaperone and she does have discharge coming from her os.  She did not have cervical motion tenderness or adnexal tenderness initially.  We will get the swabs back and get the pelvic ultrasound to rule out mass, infection, torsion, or other acute abnormality.  Anticipate reassessment after workup and will give more pain medicine.  Ultrasound returned showing evidence of possible TOA versus malignancy.  Given the now fever, purulent discharge from the os, continued pain, tachycardia, and leukocytosis, will call OB/GYN to discuss the plan  2:00 PM Spoke to OB/GYN team who reviewed the case and images.  They do feel she has a likely TOA and agrees with antibiotic management and admission.  I called pharmacy who will review what antibiotics we have here at this facility and will help order the correct medications.  The OB/GYN team will determine the appropriate attending and level of care need and I will place bed request after.  Patient agrees with this plan.         Final Clinical Impression(s) / ED Diagnoses Final diagnoses:  Generalized abdominal pain  TOA (tubo-ovarian abscess)     Clinical Impression: 1. Generalized abdominal pain   2. TOA (tubo-ovarian abscess)     Disposition: Admit  This note was prepared with assistance of Dragon voice  recognition software. Occasional wrong-word or sound-a-like substitutions may have occurred due to the inherent limitations of voice recognition software.      Laina Guerrieri, Gwenyth Allegra, MD 09/04/22 1450

## 2022-09-04 NOTE — H&P (Signed)
Sara Stevens is an 37 y.o. [phone] female.   Chief Complaint: abdominal pain  HPI: LMP is 10 days ago. Has had pain since that time which is midline and crampy. Today she woke up and could not walk. Seen in ED and found to have elevated WBC and fever to 101.2. CT showed left TOA probable with fat stranding. Given Unasyn in ED. Transferred for admission. Found to have purulent discharge on the pelvic exam there.  History reviewed. No pertinent past medical history.  Past Surgical History:  Procedure Laterality Date   CESAREAN SECTION     TUBAL LIGATION      Family History  Problem Relation Age of Onset   Healthy Mother    Healthy Father    Social History:  reports that she has been smoking cigarettes. She has never used smokeless tobacco. She reports that she does not drink alcohol and does not use drugs.  Allergies: No Known Allergies  Medications Prior to Admission  Medication Sig Dispense Refill   ibuprofen (ADVIL,MOTRIN) 200 MG tablet Take 400 mg by mouth every 6 (six) hours as needed for moderate pain.     Ibuprofen-Diphenhydramine Cit (ADVIL PM PO) Take 2 tablets by mouth at bedtime as needed (pain/sleep).      A comprehensive review of systems was negative.  Blood pressure 136/69, pulse (!) 102, temperature 99 F (37.2 C), temperature source Oral, resp. rate 17, height 5' (1.524 m), weight 88.5 kg, last menstrual period 08/27/2022, SpO2 100 %. BP 136/69 (BP Location: Left Arm)   Pulse (!) 102   Temp 99 F (37.2 C) (Oral)   Resp 17   Ht 5' (1.524 m)   Wt 88.5 kg   LMP 08/27/2022 (Approximate)   SpO2 100%   BMI 38.08 kg/m  General appearance: alert, cooperative, and appears stated age Head: Normocephalic, without obvious abnormality, atraumatic Neck: supple, symmetrical, trachea midline Lungs:  normal effort Heart: regular rate and rhythm Abdomen:  soft, exquisitely tender Extremities: extremities normal, atraumatic, no cyanosis or edema Skin: Skin color,  texture, turgor normal. No rashes or lesions Neurologic: Grossly normal   Results for orders placed or performed during the hospital encounter of 09/04/22 (from the past 24 hour(s))  Lipase, blood     Status: None   Collection Time: 09/04/22  8:24 AM  Result Value Ref Range   Lipase 19 11 - 51 U/L  Comprehensive metabolic panel     Status: Abnormal   Collection Time: 09/04/22  8:24 AM  Result Value Ref Range   Sodium 138 135 - 145 mmol/L   Potassium 3.6 3.5 - 5.1 mmol/L   Chloride 100 98 - 111 mmol/L   CO2 27 22 - 32 mmol/L   Glucose, Bld 147 (H) 70 - 99 mg/dL   BUN 7 6 - 20 mg/dL   Creatinine, Ser 0.84 0.44 - 1.00 mg/dL   Calcium 9.3 8.9 - 10.3 mg/dL   Total Protein 7.7 6.5 - 8.1 g/dL   Albumin 4.0 3.5 - 5.0 g/dL   AST 9 (L) 15 - 41 U/L   ALT 10 0 - 44 U/L   Alkaline Phosphatase 53 38 - 126 U/L   Total Bilirubin 0.4 0.3 - 1.2 mg/dL   GFR, Estimated >60 >60 mL/min   Anion gap 11 5 - 15  CBC     Status: Abnormal   Collection Time: 09/04/22  8:24 AM  Result Value Ref Range   WBC 12.4 (H) 4.0 - 10.5 K/uL  RBC 4.33 3.87 - 5.11 MIL/uL   Hemoglobin 9.4 (L) 12.0 - 15.0 g/dL   HCT 31.5 (L) 36.0 - 46.0 %   MCV 72.7 (L) 80.0 - 100.0 fL   MCH 21.7 (L) 26.0 - 34.0 pg   MCHC 29.8 (L) 30.0 - 36.0 g/dL   RDW 17.2 (H) 11.5 - 15.5 %   Platelets 385 150 - 400 K/uL   nRBC 0.0 0.0 - 0.2 %  Lactic acid, plasma     Status: None   Collection Time: 09/04/22  8:34 AM  Result Value Ref Range   Lactic Acid, Venous 1.6 0.5 - 1.9 mmol/L  Urinalysis, Routine w reflex microscopic -Urine, Clean Catch     Status: Abnormal   Collection Time: 09/04/22 10:35 AM  Result Value Ref Range   Color, Urine YELLOW YELLOW   APPearance CLEAR CLEAR   Specific Gravity, Urine 1.010 1.005 - 1.030   pH 5.5 5.0 - 8.0   Glucose, UA NEGATIVE NEGATIVE mg/dL   Hgb urine dipstick NEGATIVE NEGATIVE   Bilirubin Urine NEGATIVE NEGATIVE   Ketones, ur 15 (A) NEGATIVE mg/dL   Protein, ur NEGATIVE NEGATIVE mg/dL   Nitrite  NEGATIVE NEGATIVE   Leukocytes,Ua NEGATIVE NEGATIVE  Pregnancy, urine     Status: None   Collection Time: 09/04/22 10:35 AM  Result Value Ref Range   Preg Test, Ur NEGATIVE NEGATIVE  Wet prep, genital     Status: None   Collection Time: 09/04/22 11:31 AM   Specimen: Cervix  Result Value Ref Range   Yeast Wet Prep HPF POC NONE SEEN NONE SEEN   Trich, Wet Prep NONE SEEN NONE SEEN   Clue Cells Wet Prep HPF POC NONE SEEN NONE SEEN   WBC, Wet Prep HPF POC <10 <10   Sperm NONE SEEN   Lactic acid, plasma     Status: None   Collection Time: 09/04/22  1:00 PM  Result Value Ref Range   Lactic Acid, Venous 1.1 0.5 - 1.9 mmol/L   US PELVIC COMPLETE W TRANSVAGINAL AND TORSION R/O  Result Date: 09/04/2022 CLINICAL DATA:  Further evaluation of abnormal CT from same day. Pelvic pain. EXAM: TRANSABDOMINAL AND TRANSVAGINAL ULTRASOUND OF PELVIS DOPPLER ULTRASOUND OF OVARIES TECHNIQUE: Both transabdominal and transvaginal ultrasound examinations of the pelvis were performed. Transabdominal technique was performed for global imaging of the pelvis including uterus, ovaries, adnexal regions, and pelvic cul-de-sac. It was necessary to proceed with endovaginal exam following the transabdominal exam to visualize the uterus, endometrium, ovaries and adnexa. Color and duplex Doppler ultrasound was utilized to evaluate blood flow to the ovaries. COMPARISON:  Same day CT of the abdomen pelvis FINDINGS: Uterus Measurements: 11.1 x 6.6 x 5.2 cm = volume: 200 mL. Nabothian cysts in the cervix. Endometrium Thickness: 4.3.  No focal abnormality visualized. Right ovary Measurements: 3.6 x 2.0 x 1.9 cm = volume: 7.2 mL. Normal appearance/no adnexal mass. Left ovary Heterogeneous mixed cystic and solid lesion in the left adnexa. Pulsed Doppler evaluation of both ovaries demonstrates normal low-resistance arterial and venous waveforms. Other findings Trace pelvic free fluid. IMPRESSION: Heterogeneous mixed cystic and solid lesion in  the left adnexa, corresponding to the abnormality seen on CT. Findings are indeterminate with differential considerations primarily including tubo-ovarian abscess, hemorrhagic cyst, or ovarian neoplasm. Suggest further evaluation with physical examination and laboratory values with gynecologic consultation recommended. If clinical suspicion is high for tubo-ovarian abscess or hemorrhagic cyst would suggest a follow-up ultrasound in 6-8 weeks to ensure resolution. However, if clinical  history is more indicative of a neoplastic process would suggest further evaluation by nonemergent pelvic ultrasound with and without intravascular contrast. Electronically Signed   By: Dahlia Bailiff M.D.   On: 09/04/2022 13:02   CT ABDOMEN PELVIS W CONTRAST  Result Date: 09/04/2022 CLINICAL DATA:  Constipation, nausea, vomiting, abdominal pain and distension. Mid lower abdominal pain for few days. EXAM: CT ABDOMEN AND PELVIS WITH CONTRAST TECHNIQUE: Multidetector CT imaging of the abdomen and pelvis was performed using the standard protocol following bolus administration of intravenous contrast. RADIATION DOSE REDUCTION: This exam was performed according to the departmental dose-optimization program which includes automated exposure control, adjustment of the mA and/or kV according to patient size and/or use of iterative reconstruction technique. CONTRAST:  142m OMNIPAQUE IOHEXOL 300 MG/ML  SOLN COMPARISON:  None Available. FINDINGS: Lower chest: Lung bases are clear. Heart is at the upper limits of normal in size. No pericardial or pleural effusion. Distal esophagus is grossly unremarkable. Hepatobiliary: Liver is enlarged, 21.3 cm. Liver and gallbladder are otherwise unremarkable. No biliary ductal dilatation. Pancreas: Negative. Spleen: Negative. Adrenals/Urinary Tract: Adrenal glands and kidneys are unremarkable. Ureters are decompressed. Bladder is grossly unremarkable. Stomach/Bowel: Stomach, small bowel, appendix and colon  are unremarkable. Vascular/Lymphatic: Vascular structures are unremarkable. No pathologically enlarged lymph nodes. Reproductive: Complex heterogeneous left adnexal mass measures 5.1 x 5.9 cm (2/60). Stranding in the surrounding fat. Simple appearing 2.4 cm right ovarian cyst. Other: Small pelvic free fluid.  Trace perisplenic fluid. Musculoskeletal: None. IMPRESSION: 1. Complex left adnexal mass may represent a hemorrhagic left ovarian cyst. Malignancy cannot be excluded. The possibility of an associated hydro- or pyosalpinx on the left is considered. Pelvic ultrasound is recommended in further evaluation. 2. Small ascites. 3. Hepatomegaly. Electronically Signed   By: MLorin PicketM.D.   On: 09/04/2022 09:55    Assessment/Plan Principal Problem:   TOA (tubo-ovarian abscess) Active Problems:   Tobacco use   Hyperglycemia  Admit IV Abx Check GC/Chlam Pain control Currently not big enough for IR drainage.  TDonnamae Jude3/04/2023, 5:19 PM

## 2022-09-04 NOTE — ED Triage Notes (Signed)
C/o abdominal pain x "a few days", with constipation x 2 days. Emesis x 1 this morning. Moaning and unable to sit still in triage. Generalized abdominal pain.

## 2022-09-05 DIAGNOSIS — N7093 Salpingitis and oophoritis, unspecified: Secondary | ICD-10-CM | POA: Diagnosis not present

## 2022-09-05 LAB — CBC
HCT: 28.3 % — ABNORMAL LOW (ref 36.0–46.0)
Hemoglobin: 8.2 g/dL — ABNORMAL LOW (ref 12.0–15.0)
MCH: 21.5 pg — ABNORMAL LOW (ref 26.0–34.0)
MCHC: 29 g/dL — ABNORMAL LOW (ref 30.0–36.0)
MCV: 74.3 fL — ABNORMAL LOW (ref 80.0–100.0)
Platelets: 370 10*3/uL (ref 150–400)
RBC: 3.81 MIL/uL — ABNORMAL LOW (ref 3.87–5.11)
RDW: 17 % — ABNORMAL HIGH (ref 11.5–15.5)
WBC: 14 10*3/uL — ABNORMAL HIGH (ref 4.0–10.5)
nRBC: 0 % (ref 0.0–0.2)

## 2022-09-05 LAB — GC/CHLAMYDIA PROBE AMP (~~LOC~~) NOT AT ARMC
Chlamydia: NEGATIVE
Comment: NEGATIVE
Comment: NORMAL
Neisseria Gonorrhea: NEGATIVE

## 2022-09-05 NOTE — Progress Notes (Signed)
Patient ID: Sara Stevens, female   DOB: [birth year], 37 y.o.   MRN: [id number]   Assessment/Plan: Principal Problem:   TOA (tubo-ovarian abscess) Active Problems:   Tobacco use   Hyperglycemia  Continue IV Abx Await GC/Chlam results PO pain meds Anti-emetics  Subjective: Interval History:feels much better today  Objective: Vital signs in last 24 hours: Temp:  [98.4 F (36.9 C)-101.2 F (38.4 C)] 98.4 F (36.9 C) (03/12 0502) Pulse Rate:  [83-106] 83 (03/12 0502) Resp:  [16-18] 17 (03/12 0502) BP: (109-138)/(63-71) 109/63 (03/12 0502) SpO2:  [97 %-100 %] 99 % (03/12 0502) Weight:  [87.4 kg] 87.4 kg (03/12 0502)  Intake/Output from previous day: 03/11 0701 - 03/12 0700 In: 1683.9 [P.O.:240; I.V.:76.3] Out: -  Intake/Output this shift: No intake/output data recorded.  BP 109/63 (BP Location: Right Arm)   Pulse 83   Temp 98.4 F (36.9 C) (Oral)   Resp 17   Ht 5' (1.524 m)   Wt 87.4 kg   LMP 08/27/2022 (Approximate)   SpO2 99%   BMI 37.61 kg/m  General appearance: alert, cooperative, and appears stated age Lungs:  normal effort Heart: regular rate and rhythm Abdomen: soft, non-tender; bowel sounds normal; no masses,  no organomegaly Extremities: extremities normal, atraumatic, no cyanosis or edema  Results for orders placed or performed during the hospital encounter of 09/04/22 (from the past 24 hour(s))  Urinalysis, Routine w reflex microscopic -Urine, Clean Catch     Status: Abnormal   Collection Time: 09/04/22 10:35 AM  Result Value Ref Range   Color, Urine YELLOW YELLOW   APPearance CLEAR CLEAR   Specific Gravity, Urine 1.010 1.005 - 1.030   pH 5.5 5.0 - 8.0   Glucose, UA NEGATIVE NEGATIVE mg/dL   Hgb urine dipstick NEGATIVE NEGATIVE   Bilirubin Urine NEGATIVE NEGATIVE   Ketones, ur 15 (A) NEGATIVE mg/dL   Protein, ur NEGATIVE NEGATIVE mg/dL   Nitrite NEGATIVE NEGATIVE   Leukocytes,Ua NEGATIVE NEGATIVE  Pregnancy, urine     Status: None   Collection  Time: 09/04/22 10:35 AM  Result Value Ref Range   Preg Test, Ur NEGATIVE NEGATIVE  Wet prep, genital     Status: None   Collection Time: 09/04/22 11:31 AM   Specimen: Cervix  Result Value Ref Range   Yeast Wet Prep HPF POC NONE SEEN NONE SEEN   Trich, Wet Prep NONE SEEN NONE SEEN   Clue Cells Wet Prep HPF POC NONE SEEN NONE SEEN   WBC, Wet Prep HPF POC <10 <10   Sperm NONE SEEN   Lactic acid, plasma     Status: None   Collection Time: 09/04/22  1:00 PM  Result Value Ref Range   Lactic Acid, Venous 1.1 0.5 - 1.9 mmol/L  Blood culture (routine x 2)     Status: None (Preliminary result)   Collection Time: 09/04/22  2:00 PM   Specimen: BLOOD  Result Value Ref Range   Specimen Description      BLOOD RIGHT ANTECUBITAL Performed at [facility], [address] [place], [place] [address]    Special Requests      Blood Culture adequate volume BOTTLES DRAWN AEROBIC AND ANAEROBIC Performed at [facility], [address] [place], [place] [address]    Culture      NO GROWTH < 24 HOURS Performed at [facility], [place] [address], [place], [place] [address]    Report Status PENDING   Blood culture (routine  x 2)     Status: None (Preliminary result)   Collection Time: 09/04/22  2:08 PM   Specimen: BLOOD  Result Value Ref Range   Specimen Description      BLOOD LEFT ANTECUBITAL Performed at [facility], [address] [place], [place] [address]    Special Requests      Blood Culture adequate volume AEROBIC BOTTLE ONLY Performed at [facility], [address] [place], [place] [address]    Culture      NO GROWTH < 24 HOURS Performed at [facility], [place] [address], [place], [place] [address]    Report Status PENDING   CBC     Status: Abnormal   Collection Time: 09/05/22  5:23 AM  Result Value Ref Range   WBC 14.0 (H) 4.0 - 10.5 K/uL   RBC 3.81 (L) 3.87 - 5.11 MIL/uL   Hemoglobin 8.2 (L) 12.0 - 15.0 g/dL   HCT 28.3 (L)  36.0 - 46.0 %   MCV 74.3 (L) 80.0 - 100.0 fL   MCH 21.5 (L) 26.0 - 34.0 pg   MCHC 29.0 (L) 30.0 - 36.0 g/dL   RDW 17.0 (H) 11.5 - 15.5 %   Platelets 370 150 - 400 K/uL   nRBC 0.0 0.0 - 0.2 %    Studies/Results: US PELVIC COMPLETE W TRANSVAGINAL AND TORSION R/O  Result Date: 09/04/2022 CLINICAL DATA:  Further evaluation of abnormal CT from same day. Pelvic pain. EXAM: TRANSABDOMINAL AND TRANSVAGINAL ULTRASOUND OF PELVIS DOPPLER ULTRASOUND OF OVARIES TECHNIQUE: Both transabdominal and transvaginal ultrasound examinations of the pelvis were performed. Transabdominal technique was performed for global imaging of the pelvis including uterus, ovaries, adnexal regions, and pelvic cul-de-sac. It was necessary to proceed with endovaginal exam following the transabdominal exam to visualize the uterus, endometrium, ovaries and adnexa. Color and duplex Doppler ultrasound was utilized to evaluate blood flow to the ovaries. COMPARISON:  Same day CT of the abdomen pelvis FINDINGS: Uterus Measurements: 11.1 x 6.6 x 5.2 cm = volume: 200 mL. Nabothian cysts in the cervix. Endometrium Thickness: 4.3.  No focal abnormality visualized. Right ovary Measurements: 3.6 x 2.0 x 1.9 cm = volume: 7.2 mL. Normal appearance/no adnexal mass. Left ovary Heterogeneous mixed cystic and solid lesion in the left adnexa. Pulsed Doppler evaluation of both ovaries demonstrates normal low-resistance arterial and venous waveforms. Other findings Trace pelvic free fluid. IMPRESSION: Heterogeneous mixed cystic and solid lesion in the left adnexa, corresponding to the abnormality seen on CT. Findings are indeterminate with differential considerations primarily including tubo-ovarian abscess, hemorrhagic cyst, or ovarian neoplasm. Suggest further evaluation with physical examination and laboratory values with gynecologic consultation recommended. If clinical suspicion is high for tubo-ovarian abscess or hemorrhagic cyst would suggest a follow-up  ultrasound in 6-8 weeks to ensure resolution. However, if clinical history is more indicative of a neoplastic process would suggest further evaluation by nonemergent pelvic ultrasound with and without intravascular contrast. Electronically Signed   By: Dahlia Bailiff M.D.   On: 09/04/2022 13:02   CT ABDOMEN PELVIS W CONTRAST  Result Date: 09/04/2022 CLINICAL DATA:  Constipation, nausea, vomiting, abdominal pain and distension. Mid lower abdominal pain for few days. EXAM: CT ABDOMEN AND PELVIS WITH CONTRAST TECHNIQUE: Multidetector CT imaging of the abdomen and pelvis was performed using the standard protocol following bolus administration of intravenous contrast. RADIATION DOSE REDUCTION: This exam was performed according to the departmental dose-optimization program which includes automated exposure control, adjustment of the mA  and/or kV according to patient size and/or use of iterative reconstruction technique. CONTRAST:  119m OMNIPAQUE IOHEXOL 300 MG/ML  SOLN COMPARISON:  None Available. FINDINGS: Lower chest: Lung bases are clear. Heart is at the upper limits of normal in size. No pericardial or pleural effusion. Distal esophagus is grossly unremarkable. Hepatobiliary: Liver is enlarged, 21.3 cm. Liver and gallbladder are otherwise unremarkable. No biliary ductal dilatation. Pancreas: Negative. Spleen: Negative. Adrenals/Urinary Tract: Adrenal glands and kidneys are unremarkable. Ureters are decompressed. Bladder is grossly unremarkable. Stomach/Bowel: Stomach, small bowel, appendix and colon are unremarkable. Vascular/Lymphatic: Vascular structures are unremarkable. No pathologically enlarged lymph nodes. Reproductive: Complex heterogeneous left adnexal mass measures 5.1 x 5.9 cm (2/60). Stranding in the surrounding fat. Simple appearing 2.4 cm right ovarian cyst. Other: Small pelvic free fluid.  Trace perisplenic fluid. Musculoskeletal: None. IMPRESSION: 1. Complex left adnexal mass may represent a  hemorrhagic left ovarian cyst. Malignancy cannot be excluded. The possibility of an associated hydro- or pyosalpinx on the left is considered. Pelvic ultrasound is recommended in further evaluation. 2. Small ascites. 3. Hepatomegaly. Electronically Signed   By: MLorin PicketM.D.   On: 09/04/2022 09:55    Scheduled Meds:  enoxaparin (LOVENOX) injection  40 mg Subcutaneous Q24H   metroNIDAZOLE  500 mg Oral BID   Continuous Infusions:  sodium chloride 50 mL/hr at 09/04/22 1629   cefTRIAXone (ROCEPHIN)  IV 1 g (09/04/22 1727)   doxycycline (VIBRAMYCIN) IV 100 mg (09/05/22 0459)   PRN Meds:alum & mag hydroxide-simeth, guaiFENesin, HYDROmorphone (DILAUDID) injection, ibuprofen, menthol-cetylpyridinium, ondansetron **OR** ondansetron (ZOFRAN) IV, oxyCODONE-acetaminophen, polyethylene glycol    LOS: 1 day   TDonnamae Jude MD 09/05/2022 10:32 AM

## 2022-09-05 NOTE — Progress Notes (Signed)
CSW met with patient at bedside, patient accompanied by two guests. CSW introduced self. Patient granted CSW verbal permission to speak in front of guests about anything. CSW explained reason for consult. Patient reported that she was unsure if she still had insurance. CSW explained to patient that [place] [employer] [place] is listed as her insurance. Patient reported having Medicaid in the past and reported that she was just unsure. CSW inquired about food insecurity noted, patient denied any concerns or issues with getting food. CSW inquired about transportation needs, patient reported no needs or concerns with transportation. CSW inquired about any concerns with utilities, patient reported none. CSW inquired about any housing concerns, patient reported none. CSW asked if patient had any needs/concerns, patient reported none.   Abundio Miu, [place] Worker [facility] Cell#: [phone]

## 2022-09-06 DIAGNOSIS — D509 Iron deficiency anemia, unspecified: Secondary | ICD-10-CM | POA: Diagnosis present

## 2022-09-06 LAB — CBC WITH DIFFERENTIAL/PLATELET
Abs Immature Granulocytes: 0.1 10*3/uL — ABNORMAL HIGH (ref 0.00–0.07)
Basophils Absolute: 0 10*3/uL (ref 0.0–0.1)
Basophils Relative: 0 %
Eosinophils Absolute: 0.1 10*3/uL (ref 0.0–0.5)
Eosinophils Relative: 1 %
HCT: 26.9 % — ABNORMAL LOW (ref 36.0–46.0)
Hemoglobin: 8.1 g/dL — ABNORMAL LOW (ref 12.0–15.0)
Immature Granulocytes: 1 %
Lymphocytes Relative: 10 %
Lymphs Abs: 1.2 10*3/uL (ref 0.7–4.0)
MCH: 21.8 pg — ABNORMAL LOW (ref 26.0–34.0)
MCHC: 30.1 g/dL (ref 30.0–36.0)
MCV: 72.5 fL — ABNORMAL LOW (ref 80.0–100.0)
Monocytes Absolute: 1.2 10*3/uL — ABNORMAL HIGH (ref 0.1–1.0)
Monocytes Relative: 10 %
Neutro Abs: 9.8 10*3/uL — ABNORMAL HIGH (ref 1.7–7.7)
Neutrophils Relative %: 78 %
Platelets: 378 10*3/uL (ref 150–400)
RBC: 3.71 MIL/uL — ABNORMAL LOW (ref 3.87–5.11)
RDW: 17.2 % — ABNORMAL HIGH (ref 11.5–15.5)
WBC: 12.3 10*3/uL — ABNORMAL HIGH (ref 4.0–10.5)
nRBC: 0 % (ref 0.0–0.2)

## 2022-09-06 LAB — RETICULOCYTES
Immature Retic Fract: 13.9 % (ref 2.3–15.9)
RBC.: 3.72 MIL/uL — ABNORMAL LOW (ref 3.87–5.11)
Retic Count, Absolute: 30.9 10*3/uL (ref 19.0–186.0)
Retic Ct Pct: 0.8 % (ref 0.4–3.1)

## 2022-09-06 LAB — HEMOGLOBIN A1C
Hgb A1c MFr Bld: 6 % — ABNORMAL HIGH (ref 4.8–5.6)
Mean Plasma Glucose: 126 mg/dL

## 2022-09-06 LAB — FOLATE: Folate: 7.1 ng/mL (ref >5.9)

## 2022-09-06 LAB — IRON AND TIBC
Iron: 10 ug/dL — ABNORMAL LOW (ref 28–170)
Saturation Ratios: 3 % — ABNORMAL LOW (ref 10.4–31.8)
TIBC: 305 ug/dL (ref 250–450)
UIBC: 295 ug/dL

## 2022-09-06 LAB — VITAMIN B12: Vitamin B-12: 782 pg/mL (ref 180–914)

## 2022-09-06 LAB — FERRITIN: Ferritin: 37 ng/mL (ref 11–307)

## 2022-09-06 MED ORDER — FAMOTIDINE 20 MG PO TABS
20.0000 mg | ORAL_TABLET | Freq: Two times a day (BID) | ORAL | Status: DC
  Administered 2022-09-06 – 2022-09-07 (×3): 20 mg via ORAL
  Filled 2022-09-06 (×3): qty 1

## 2022-09-06 MED ORDER — OXYCODONE HCL 5 MG PO TABS: 5.0000 mg | ORAL_TABLET | ORAL | Status: DC | PRN

## 2022-09-06 MED ORDER — BISACODYL 5 MG PO TBEC
5.0000 mg | DELAYED_RELEASE_TABLET | Freq: Once | ORAL | Status: DC
  Filled 2022-09-06: qty 1

## 2022-09-06 MED ORDER — POLYETHYLENE GLYCOL 3350 17 G PO PACK
17.0000 g | PACK | Freq: Every day | ORAL | Status: DC
  Administered 2022-09-06: 17 g via ORAL
  Filled 2022-09-06 (×2): qty 1

## 2022-09-06 NOTE — Progress Notes (Addendum)
Gynecology Progress Note  Admission Date: 09/04/2022 Current Date: 09/06/2022 9:00 AM  Sara Stevens is a 37 y.o. [id number] HD#3 admitted for left 5-6cm TOA   History complicated by: Patient Active Problem List   Diagnosis Date Noted   TOA (tubo-ovarian abscess) 09/04/2022   Tobacco use 09/04/2022   Hyperglycemia 09/04/2022    ROS and patient/family/surgical history, located on admission H&P note dated 09/04/2022, have been reviewed, and there are no changes except as noted below Yesterday/Overnight Events:  none  Subjective:  Pain still there but improved. Taking some PO and able to keep it down. +flatus, no BM, +ambulation  Objective:    Current Vital Signs 24h Vital Sign Ranges  T 98.5 F (36.9 C) Temp  Avg: 97.9 F (36.6 C)  Min: 97.6 F (36.4 C)  Max: 98.5 F (36.9 C)  BP 111/73 BP  Min: 109/53  Max: 116/56  HR 77 Pulse  Avg: 77  Min: 71  Max: 81  RR 18 Resp  Avg: 17.3  Min: 16  Max: 18  SaO2 100 % Room Air SpO2  Avg: 99 %  Min: 98 %  Max: 100 %       24 Hour I/O Current Shift I/O  Time Ins Outs No intake/output data recorded. No intake/output data recorded.   Patient Vitals for the past 24 hrs:  BP Temp Temp src Pulse Resp SpO2 Weight  09/06/22 0740 111/73 98.5 F (36.9 C) Oral 77 18 100 % --  09/06/22 0605 (!) 109/53 98 F (36.7 C) Oral 79 18 99 % 91.1 kg  09/05/22 1937 (!) 116/56 97.6 F (36.4 C) Oral 81 16 -- --  09/05/22 1255 112/62 97.6 F (36.4 C) Oral 71 17 98 % --   Last Temp: 3/11 at 1300: 101.6  Physical exam: General appearance: alert, cooperative, and appears stated age Abdomen:  +BS, mildly ttp, no peritoneal s/s, moderate distension Lungs: clear to auscultation bilaterally Heart: S1, S2 normal, no murmur, rub or gallop, regular rate and rhythm Extremities: no lower extremity edema Skin: warm and dry Psych: appropriate Neurologic: Grossly normal  Medications Current Facility-Administered Medications  Medication Dose Route Frequency  Provider Last Rate Last Admin   0.9 %  sodium chloride infusion   Intravenous Continuous Donnamae Jude, MD 50 mL/hr at 09/04/22 1629 New Bag at 09/04/22 1629   alum & mag hydroxide-simeth (MAALOX/MYLANTA) 200-200-20 MG/5ML suspension 30 mL  30 mL Oral Q4H PRN Donnamae Jude, MD       cefTRIAXone (ROCEPHIN) 1 g in sodium chloride 0.9 % 100 mL IVPB  1 g Intravenous Q24H Donnamae Jude, MD 200 mL/hr at 09/05/22 1721 1 g at 09/05/22 1721   doxycycline (VIBRAMYCIN) 100 mg in sodium chloride 0.9 % 250 mL IVPB  100 mg Intravenous Q12H Donnamae Jude, MD 125 mL/hr at 09/06/22 0606 100 mg at 09/06/22 0606   enoxaparin (LOVENOX) injection 40 mg  40 mg Subcutaneous Q24H Donnamae Jude, MD   40 mg at 09/05/22 2221   guaiFENesin (ROBITUSSIN) 100 MG/5ML liquid 15 mL  15 mL Oral Q4H PRN Donnamae Jude, MD       HYDROmorphone (DILAUDID) injection 0.2-0.6 mg  0.2-0.6 mg Intravenous Q2H PRN Donnamae Jude, MD       ibuprofen (ADVIL) tablet 600 mg  600 mg Oral Q6H PRN Donnamae Jude, MD   600 mg at 09/05/22 1458   menthol-cetylpyridinium (CEPACOL) lozenge 3 mg  1 lozenge Oral Q2H PRN Darron Doom  S, MD       metroNIDAZOLE (FLAGYL) tablet 500 mg  500 mg Oral BID Donnamae Jude, MD   500 mg at 09/06/22 0824   ondansetron (ZOFRAN) tablet 4 mg  4 mg Oral Q6H PRN Donnamae Jude, MD   4 mg at 09/05/22 1458   Or   ondansetron (ZOFRAN) injection 4 mg  4 mg Intravenous Q6H PRN Donnamae Jude, MD       oxyCODONE-acetaminophen (PERCOCET/ROXICET) 5-325 MG per tablet 1-2 tablet  1-2 tablet Oral Q3H PRN Donnamae Jude, MD   1 tablet at 09/06/22 0835   polyethylene glycol (MIRALAX / GLYCOLAX) packet 17 g  17 g Oral Daily PRN Donnamae Jude, MD   17 g at 09/04/22 1820      Labs  Negative gc/ct/wet prep  Recent Labs  Lab 09/04/22 0824 09/05/22 0523 09/06/22 0409  WBC 12.4* 14.0* 12.3*  HGB 9.4* 8.2* 8.1*  HCT 31.5* 28.3* 26.9*  PLT 385 370 378    Recent Labs  Lab 09/04/22 0824  NA 138  K 3.6  CL 100  CO2 27   BUN 7  CREATININE 0.84  CALCIUM 9.3  PROT 7.7  BILITOT 0.4  ALKPHOS 53  ALT 10  AST 9*  GLUCOSE 147*    Radiology No new imaging 3/11: u/s Left ovary   Heterogeneous mixed cystic and solid lesion in the left adnexa.   Pulsed Doppler evaluation of both ovaries demonstrates normal low-resistance arterial and venous waveforms.  3/11 CT Narrative & Impression  CLINICAL DATA:  Constipation, nausea, vomiting, abdominal pain and distension. Mid lower abdominal pain for few days.   EXAM: CT ABDOMEN AND PELVIS WITH CONTRAST   TECHNIQUE: Multidetector CT imaging of the abdomen and pelvis was performed using the standard protocol following bolus administration of intravenous contrast.   RADIATION DOSE REDUCTION: This exam was performed according to the departmental dose-optimization program which includes automated exposure control, adjustment of the mA and/or kV according to patient size and/or use of iterative reconstruction technique.   CONTRAST:  170m OMNIPAQUE IOHEXOL 300 MG/ML  SOLN   COMPARISON:  None Available.   FINDINGS: Lower chest: Lung bases are clear. Heart is at the upper limits of normal in size. No pericardial or pleural effusion. Distal esophagus is grossly unremarkable.   Hepatobiliary: Liver is enlarged, 21.3 cm. Liver and gallbladder are otherwise unremarkable. No biliary ductal dilatation.   Pancreas: Negative.   Spleen: Negative.   Adrenals/Urinary Tract: Adrenal glands and kidneys are unremarkable. Ureters are decompressed. Bladder is grossly unremarkable.   Stomach/Bowel: Stomach, small bowel, appendix and colon are unremarkable.   Vascular/Lymphatic: Vascular structures are unremarkable. No pathologically enlarged lymph nodes.   Reproductive: Complex heterogeneous left adnexal mass measures 5.1 x 5.9 cm (2/60). Stranding in the surrounding fat. Simple appearing 2.4 cm right ovarian cyst.   Other: Small pelvic free fluid.  Trace  perisplenic fluid.   Musculoskeletal: None.   IMPRESSION: 1. Complex left adnexal mass may represent a hemorrhagic left ovarian cyst. Malignancy cannot be excluded. The possibility of an associated hydro- or pyosalpinx on the left is considered. Pelvic ultrasound is recommended in further evaluation. 2. Small ascites. 3. Hepatomegaly.     Electronically Signed   By: MLorin PicketM.D.   On: 09/04/2022 09:55   Assessment & Plan:  Patient stable *GYN: continue IV abx and f/u s/s and white count tomorrow. If continuing to improve, can switch to PO meds and d/w pt maybe late  d/c tomorrow but more likely AM Friday d/c if continuing to improve -will need repeat imaging in 4-6 weeks -message sent to [place] for f/u next week *Anemia: f/u panel and order IV iron PRN *Pain: continue current regimen *FEN/GI: SLIV, regular diet. Miralax and dulcolax added *PPx: pepcid added, continue lovenox *Dispo: see above  Code Status: Full Code  Total time taking care of the patient was 15 minutes, with greater than 50% of the time spent in face to face interaction with the patient.  Durene Romans MD [facility]ending [facility] GYN Consult Phone: [phone] (M-F, 0800-1700) & [phone] (Off hours, weekends, holidays)

## 2022-09-07 ENCOUNTER — Other Ambulatory Visit (HOSPITAL_COMMUNITY): Payer: Self-pay

## 2022-09-07 LAB — CBC WITH DIFFERENTIAL/PLATELET
Abs Immature Granulocytes: 0.06 10*3/uL (ref 0.00–0.07)
Basophils Absolute: 0 10*3/uL (ref 0.0–0.1)
Basophils Relative: 0 %
Eosinophils Absolute: 0.1 10*3/uL (ref 0.0–0.5)
Eosinophils Relative: 1 %
HCT: 25.8 % — ABNORMAL LOW (ref 36.0–46.0)
Hemoglobin: 8 g/dL — ABNORMAL LOW (ref 12.0–15.0)
Immature Granulocytes: 1 %
Lymphocytes Relative: 19 %
Lymphs Abs: 1.5 10*3/uL (ref 0.7–4.0)
MCH: 22.6 pg — ABNORMAL LOW (ref 26.0–34.0)
MCHC: 31 g/dL (ref 30.0–36.0)
MCV: 72.9 fL — ABNORMAL LOW (ref 80.0–100.0)
Monocytes Absolute: 0.9 10*3/uL (ref 0.1–1.0)
Monocytes Relative: 12 %
Neutro Abs: 5.4 10*3/uL (ref 1.7–7.7)
Neutrophils Relative %: 67 %
Platelets: 449 10*3/uL — ABNORMAL HIGH (ref 150–400)
RBC: 3.54 MIL/uL — ABNORMAL LOW (ref 3.87–5.11)
RDW: 17.2 % — ABNORMAL HIGH (ref 11.5–15.5)
WBC: 8 10*3/uL (ref 4.0–10.5)
nRBC: 0 % (ref 0.0–0.2)

## 2022-09-07 MED ORDER — METRONIDAZOLE 500 MG PO TABS
500.0000 mg | ORAL_TABLET | Freq: Two times a day (BID) | ORAL | 0 refills | Status: AC
  Filled 2022-09-07: qty 22, 11d supply, fill #0

## 2022-09-07 MED ORDER — FERROUS SULFATE 325 (65 FE) MG PO TABS
325.0000 mg | ORAL_TABLET | ORAL | Status: DC
  Administered 2022-09-07: 325 mg via ORAL
  Filled 2022-09-07: qty 1

## 2022-09-07 MED ORDER — FERROUS SULFATE 325 (65 FE) MG PO TABS
325.0000 mg | ORAL_TABLET | ORAL | 3 refills | Status: AC
  Filled 2022-09-07: qty 90, 180d supply, fill #0

## 2022-09-07 MED ORDER — METRONIDAZOLE 500 MG PO TABS: 500.0000 mg | ORAL_TABLET | Freq: Two times a day (BID) | ORAL | Status: DC

## 2022-09-07 MED ORDER — DOXYCYCLINE HYCLATE 100 MG PO TABS
100.0000 mg | ORAL_TABLET | Freq: Two times a day (BID) | ORAL | 0 refills | Status: AC
  Filled 2022-09-07: qty 22, 11d supply, fill #0

## 2022-09-07 MED ORDER — IBUPROFEN 200 MG PO TABS: 600.0000 mg | ORAL_TABLET | Freq: Four times a day (QID) | ORAL | 0 refills | Status: AC | PRN

## 2022-09-07 MED ORDER — METRONIDAZOLE 500 MG PO TABS
500.0000 mg | ORAL_TABLET | Freq: Two times a day (BID) | ORAL | 0 refills | Status: DC
  Filled 2022-09-07: qty 28, 14d supply, fill #0

## 2022-09-07 MED ORDER — DOXYCYCLINE HYCLATE 100 MG PO TABS
100.0000 mg | ORAL_TABLET | Freq: Two times a day (BID) | ORAL | Status: DC
  Administered 2022-09-07: 100 mg via ORAL
  Filled 2022-09-07: qty 1

## 2022-09-07 MED ORDER — DOXYCYCLINE HYCLATE 100 MG PO TABS
100.0000 mg | ORAL_TABLET | Freq: Two times a day (BID) | ORAL | 0 refills | Status: DC
  Filled 2022-09-07: qty 28, 14d supply, fill #0

## 2022-09-07 NOTE — Progress Notes (Addendum)
[facility] NOTE  Sara Stevens is a 37 y.o. G4P4 with LMP 08/27/22 admitted for TOA   Length of Stay:  3 Days. Admitted 09/04/2022  Subjective: Feeling improved this morning. Pain controlled - has not required oxy since yesterday AM. Denies fevers/chills/nausea/vomiting. Had an episode of diarrhea/loose stools.   Vitals:  Blood pressure 120/79, pulse 77, temperature 98.5 F (36.9 C), temperature source Oral, resp. rate 16, height 5' (1.524 m), weight 88 kg, last menstrual period 08/27/2022, SpO2 99 %. Physical Examination: CONSTITUTIONAL: Well appearing, no acute distress NEUROLOGIC: Alert and oriented to person, place, and time. PSYCHIATRIC: Normal mood and affect. Normal behavior. Normal judgment and thought content. CARDIOVASCULAR: Normal heart rate noted RESPIRATORY: Effort normal, no problems with respiration noted ABDOMEN: Soft, nontender, nondistended  Results for orders placed or performed during the hospital encounter of 09/04/22 (from the past 48 hour(s))  CBC with Differential/Platelet     Status: Abnormal   Collection Time: 09/06/22  4:09 AM  Result Value Ref Range   WBC 12.3 (H) 4.0 - 10.5 K/uL   RBC 3.71 (L) 3.87 - 5.11 MIL/uL   Hemoglobin 8.1 (L) 12.0 - 15.0 g/dL    Comment: Reticulocyte Hemoglobin testing may be clinically indicated, consider ordering this additional test [id number]    HCT 26.9 (L) 36.0 - 46.0 %   MCV 72.5 (L) 80.0 - 100.0 fL   MCH 21.8 (L) 26.0 - 34.0 pg   MCHC 30.1 30.0 - 36.0 g/dL   RDW 17.2 (H) 11.5 - 15.5 %   Platelets 378 150 - 400 K/uL   nRBC 0.0 0.0 - 0.2 %   Neutrophils Relative % 78 %   Neutro Abs 9.8 (H) 1.7 - 7.7 K/uL   Lymphocytes Relative 10 %   Lymphs Abs 1.2 0.7 - 4.0 K/uL   Monocytes Relative 10 %   Monocytes Absolute 1.2 (H) 0.1 - 1.0 K/uL   Eosinophils Relative 1 %   Eosinophils Absolute 0.1 0.0 - 0.5 K/uL   Basophils Relative 0 %   Basophils Absolute 0.0 0.0 - 0.1 K/uL   Immature  Granulocytes 1 %   Abs Immature Granulocytes 0.10 (H) 0.00 - 0.07 K/uL    Comment: Performed at [facility], [address], [place], [place] [address]  Vitamin B12     Status: None   Collection Time: 09/06/22  8:01 AM  Result Value Ref Range   Vitamin B-12 782 180 - 914 pg/mL    Comment: (NOTE) This assay is not validated for testing neonatal or myeloproliferative syndrome specimens for Vitamin B12 levels. Performed at [facility], [place] [address] [place], [place] [address]   Folate     Status: None   Collection Time: 09/06/22  8:01 AM  Result Value Ref Range   Folate 7.1 >5.9 ng/mL    Comment: Performed at [facility] [address] [place], [place] [address]  Iron and TIBC     Status: Abnormal   Collection Time: 09/06/22  8:01 AM  Result Value Ref Range   Iron 10 (L) 28 - 170 ug/dL   TIBC 305 250 - 450 ug/dL   Saturation Ratios 3 (L) 10.4 - 31.8 %   UIBC 295 ug/dL    Comment: Performed at [facility], [place] [address] [place],  [address]  Ferritin     Status: None   Collection Time: 09/06/22  8:01 AM  Result Value Ref Range   Ferritin  37 11 - 307 ng/mL    Comment: Performed at [facility], [place] [address] [place], [place] [address]  Reticulocytes     Status: Abnormal   Collection Time: 09/06/22  8:01 AM  Result Value Ref Range   Retic Ct Pct 0.8 0.4 - 3.1 %   RBC. 3.72 (L) 3.87 - 5.11 MIL/uL   Retic Count, Absolute 30.9 19.0 - 186.0 K/uL   Immature Retic Fract 13.9 2.3 - 15.9 %    Comment: Performed at [facility] [address] [place], [place] [address]  Hemoglobin A1c     Status: Abnormal   Collection Time: 09/06/22  8:01 AM  Result Value Ref Range   Hgb A1c MFr Bld 6.0 (H) 4.8 - 5.6 %    Comment: (NOTE)         Prediabetes: 5.7 - 6.4         Diabetes: >6.4         Glycemic control for adults with diabetes: <7.0    Mean Plasma Glucose 126 mg/dL    Comment: (NOTE) Performed At: [facility] [place], [place] [id number] Rush Farmer MD [id number]   CBC with Differential/Platelet     Status: Abnormal   Collection Time: 09/07/22  5:18 AM  Result Value Ref Range   WBC 8.0 4.0 - 10.5 K/uL   RBC 3.54 (L) 3.87 - 5.11 MIL/uL   Hemoglobin 8.0 (L) 12.0 - 15.0 g/dL    Comment: Reticulocyte Hemoglobin testing may be clinically indicated, consider ordering this additional test [id number]    HCT 25.8 (L) 36.0 - 46.0 %   MCV 72.9 (L) 80.0 - 100.0 fL   MCH 22.6 (L) 26.0 - 34.0 pg   MCHC 31.0 30.0 - 36.0 g/dL   RDW 17.2 (H) 11.5 - 15.5 %   Platelets 449 (H) 150 - 400 K/uL   nRBC 0.0 0.0 - 0.2 %   Neutrophils Relative % 67 %   Neutro Abs 5.4 1.7 - 7.7 K/uL   Lymphocytes Relative 19 %   Lymphs Abs 1.5 0.7 - 4.0 K/uL   Monocytes Relative 12 %   Monocytes Absolute 0.9 0.1 - 1.0 K/uL   Eosinophils Relative 1 %   Eosinophils Absolute 0.1 0.0 - 0.5 K/uL   Basophils Relative 0 %   Basophils Absolute 0.0 0.0 - 0.1 K/uL   Immature Granulocytes 1 %   Abs Immature Granulocytes 0.06 0.00 - 0.07 K/uL    Comment: Performed at [facility], [address], [place], [place] [address]   No results found.  Current scheduled medications  bisacodyl  5 mg Oral Once   doxycycline  100 mg Oral Q12H   enoxaparin (LOVENOX) injection  40 mg Subcutaneous Q24H   famotidine  20 mg Oral BID   ferrous sulfate  325 mg Oral QODAY   metroNIDAZOLE  500 mg Oral Q12H   polyethylene glycol  17 g Oral Daily   I have reviewed the patient's current medications.  ASSESSMENT: Principal Problem:   TOA (tubo-ovarian abscess) Active Problems:   Tobacco use   Hyperglycemia   Iron deficiency anemia   PLAN: TOA - Recent Tmax 100.3 on 3/13 at 4p; leukocytosis resolved. Exam reassuring & pt well appearing. GC/CT neg - Transition to PO doxy/flagyl today - Has f/u appt scheduled for 3/21 2:50p - Plan for repeat US in 4-6wk  2. Iron deficiency anemia - iron studies c/w IDA - PO iron qod  3.  Prediabetes - A1c 6.0 - PCP follow up  Ppx: pLov, SCDs  Dispo: continue inpatient admission. Potential PM discharge pending patient symptoms & vitals throughout the day today.  Gale Journey, MD [place], [facility] for [employer], [place]

## 2022-09-07 NOTE — Discharge Summary (Signed)
Antenatal Physician Discharge Summary  Patient ID: Sara Stevens MRN: [id number] DOB/AGE: 10/12/1985 37 y.o.  Admit date: 09/04/2022 Discharge date: 09/07/2022  Admission Diagnoses: Generalized abdominal pain [R10.84] TOA (tubo-ovarian abscess) [N70.93]  Discharge Diagnoses:  [facility] Course:  Sara Stevens is a 37 y.o. G4P4 with LMP 08/27/22 who was admitted for [facility].   [name] initially presented to the ED on 3/11 with severe midline/crampy pain and fevers. In the ED she was febrile to 101.2 with a mild leukocytosis (WBC 12.4). CT A/P showed a 5.1 x 5.9cm complex L adnexal mass with surrounding fat stranding and pelvic US confirmed presence of a heterogenous mixed cystic and solid lesion in the left adnexa. Gonorrhea and chlamydia testing was negative. She was admitted to gynecology for IV antibiotics and pain control for suspected TOA.   While admitted, the patient was afebrile and hemodynamically stable. She received IV ceftriaxone, doxycycline and flagyl x 3 days and was transitioned to PO doxy & flagyl with plan for a total 14d course. She clinically improved and her leukocytosis resolved. Blood cultures remained no growth to date. On day of discharge, she was tolerating a regular diet, ambulating without difficulty, and she no longer required pain medications. She will follow up with Dr. Rip Harbour on 3/21 with repeat pelvic ultrasound in 4-6 weeks.  Of note, the patient was also diagnosed with iron deficiency anemia and prediabetes (A1c 6.0) during this admission. She was discharged home with po iron supplementation. She was advised to follow up with her primary care physician for further management of her prediabetes and iron deficiency anemia.   Discharge Exam: Temp:  [97.7 F (36.5 C)-98.9 F (37.2 C)] 98.6 F (37 C) (03/14 1641) Pulse Rate:  [70-85] 85 (03/14 1641) Resp:  [16-20] 20 (03/14 1641) BP: (120-139)/(68-79) 139/79 (03/14 1641) SpO2:  [98 %-99 %] 99 % (03/14  1641) Weight:  [88 kg] 88 kg (03/14 0300) Physical Examination: CONSTITUTIONAL: Well-developed, well-nourished female in no acute distress.  SKIN: Skin is warm and dry. No rash noted. Not diaphoretic. No erythema. No pallor. NEUROLOGIC: Alert and oriented to person, place, and time.  PSYCHIATRIC: Normal mood and affect. Normal behavior. Normal judgment and thought content. CARDIOVASCULAR: Normal heart rate noted RESPIRATORY: Effort normal, no problems with respiration noted ABDOMEN: Soft, nontender, nondistended   Significant Diagnostic Studies:  Results for orders placed or performed during the hospital encounter of 09/04/22 (from the past 168 hour(s))  Lipase, blood   Collection Time: 09/04/22  8:24 AM  Result Value Ref Range   Lipase 19 11 - 51 U/L  Comprehensive metabolic panel   Collection Time: 09/04/22  8:24 AM  Result Value Ref Range   Sodium 138 135 - 145 mmol/L   Potassium 3.6 3.5 - 5.1 mmol/L   Chloride 100 98 - 111 mmol/L   CO2 27 22 - 32 mmol/L   Glucose, Bld 147 (H) 70 - 99 mg/dL   BUN 7 6 - 20 mg/dL   Creatinine, Ser 0.84 0.44 - 1.00 mg/dL   Calcium 9.3 8.9 - 10.3 mg/dL   Total Protein 7.7 6.5 - 8.1 g/dL   Albumin 4.0 3.5 - 5.0 g/dL   AST 9 (L) 15 - 41 U/L   ALT 10 0 - 44 U/L   Alkaline Phosphatase 53 38 - 126 U/L   Total Bilirubin 0.4 0.3 - 1.2 mg/dL   GFR, Estimated >60 >60 mL/min   Anion gap 11 5 - 15  CBC   Collection Time:  09/04/22  8:24 AM  Result Value Ref Range   WBC 12.4 (H) 4.0 - 10.5 K/uL   RBC 4.33 3.87 - 5.11 MIL/uL   Hemoglobin 9.4 (L) 12.0 - 15.0 g/dL   HCT 31.5 (L) 36.0 - 46.0 %   MCV 72.7 (L) 80.0 - 100.0 fL   MCH 21.7 (L) 26.0 - 34.0 pg   MCHC 29.8 (L) 30.0 - 36.0 g/dL   RDW 17.2 (H) 11.5 - 15.5 %   Platelets 385 150 - 400 K/uL   nRBC 0.0 0.0 - 0.2 %  Lactic acid, plasma   Collection Time: 09/04/22  8:34 AM  Result Value Ref Range   Lactic Acid, Venous 1.6 0.5 - 1.9 mmol/L  Urinalysis, Routine w reflex microscopic -Urine, Clean  Catch   Collection Time: 09/04/22 10:35 AM  Result Value Ref Range   Color, Urine YELLOW YELLOW   APPearance CLEAR CLEAR   Specific Gravity, Urine 1.010 1.005 - 1.030   pH 5.5 5.0 - 8.0   Glucose, UA NEGATIVE NEGATIVE mg/dL   Hgb urine dipstick NEGATIVE NEGATIVE   Bilirubin Urine NEGATIVE NEGATIVE   Ketones, ur 15 (A) NEGATIVE mg/dL   Protein, ur NEGATIVE NEGATIVE mg/dL   Nitrite NEGATIVE NEGATIVE   Leukocytes,Ua NEGATIVE NEGATIVE  Pregnancy, urine   Collection Time: 09/04/22 10:35 AM  Result Value Ref Range   Preg Test, Ur NEGATIVE NEGATIVE  Wet prep, genital   Collection Time: 09/04/22 11:31 AM   Specimen: Cervix  Result Value Ref Range   Yeast Wet Prep HPF POC NONE SEEN NONE SEEN   Trich, Wet Prep NONE SEEN NONE SEEN   Clue Cells Wet Prep HPF POC NONE SEEN NONE SEEN   WBC, Wet Prep HPF POC <10 <10   Sperm NONE SEEN   GC/Chlamydia probe amp ([place]) not at [facility]   Collection Time: 09/04/22 11:31 AM  Result Value Ref Range   Neisseria Gonorrhea Negative    Chlamydia Negative    Comment Normal Reference [place] Chlamydia - Negative    Comment      Normal Reference Range Neisseria Gonorrhea - Negative  Lactic acid, plasma   Collection Time: 09/04/22  1:00 PM  Result Value Ref Range   Lactic Acid, Venous 1.1 0.5 - 1.9 mmol/L  Blood culture (routine x 2)   Collection Time: 09/04/22  2:00 PM   Specimen: BLOOD  Result Value Ref Range   Specimen Description      BLOOD RIGHT ANTECUBITAL Performed at [facility], [address] [place], [place] [address]    Special Requests      Blood Culture adequate volume BOTTLES DRAWN AEROBIC AND ANAEROBIC Performed at [facility], [address] [place], [place] [address]    Culture      NO GROWTH 3 DAYS Performed at [facility], [place] [address], [place], [place] [address]    Report Status PENDING   Blood culture (routine x 2)   Collection Time: 09/04/22  2:08 PM   Specimen: BLOOD  Result  Value Ref Range   Specimen Description      BLOOD LEFT ANTECUBITAL Performed at [facility], [address] [place], [place] [address]    Special Requests      Blood Culture adequate volume AEROBIC BOTTLE ONLY Performed at [facility], [address] [place], [place] [address]    Culture      NO GROWTH 3 DAYS Performed  at [facility], [place] [address] [place], [place] [address]    Report Status PENDING   CBC   Collection Time: 09/05/22  5:23 AM  Result Value Ref Range   WBC 14.0 (H) 4.0 - 10.5 K/uL   RBC 3.81 (L) 3.87 - 5.11 MIL/uL   Hemoglobin 8.2 (L) 12.0 - 15.0 g/dL   HCT 28.3 (L) 36.0 - 46.0 %   MCV 74.3 (L) 80.0 - 100.0 fL   MCH 21.5 (L) 26.0 - 34.0 pg   MCHC 29.0 (L) 30.0 - 36.0 g/dL   RDW 17.0 (H) 11.5 - 15.5 %   Platelets 370 150 - 400 K/uL   nRBC 0.0 0.0 - 0.2 %  CBC with Differential/Platelet   Collection Time: 09/06/22  4:09 AM  Result Value Ref Range   WBC 12.3 (H) 4.0 - 10.5 K/uL   RBC 3.71 (L) 3.87 - 5.11 MIL/uL   Hemoglobin 8.1 (L) 12.0 - 15.0 g/dL   HCT 26.9 (L) 36.0 - 46.0 %   MCV 72.5 (L) 80.0 - 100.0 fL   MCH 21.8 (L) 26.0 - 34.0 pg   MCHC 30.1 30.0 - 36.0 g/dL   RDW 17.2 (H) 11.5 - 15.5 %   Platelets 378 150 - 400 K/uL   nRBC 0.0 0.0 - 0.2 %   Neutrophils Relative % 78 %   Neutro Abs 9.8 (H) 1.7 - 7.7 K/uL   Lymphocytes Relative 10 %   Lymphs Abs 1.2 0.7 - 4.0 K/uL   Monocytes Relative 10 %   Monocytes Absolute 1.2 (H) 0.1 - 1.0 K/uL   Eosinophils Relative 1 %   Eosinophils Absolute 0.1 0.0 - 0.5 K/uL   Basophils Relative 0 %   Basophils Absolute 0.0 0.0 - 0.1 K/uL   Immature Granulocytes 1 %   Abs Immature Granulocytes 0.10 (H) 0.00 - 0.07 K/uL  Vitamin B12   Collection Time: 09/06/22  8:01 AM  Result Value Ref Range   Vitamin B-12 782 180 - 914 pg/mL  Folate   Collection Time: 09/06/22  8:01 AM  Result Value Ref Range   Folate 7.1 >5.9 ng/mL  Iron and TIBC   Collection Time: 09/06/22  8:01 AM  Result  Value Ref Range   Iron 10 (L) 28 - 170 ug/dL   TIBC 305 250 - 450 ug/dL   Saturation Ratios 3 (L) 10.4 - 31.8 %   UIBC 295 ug/dL  Ferritin   Collection Time: 09/06/22  8:01 AM  Result Value Ref Range   Ferritin 37 11 - 307 ng/mL  Reticulocytes   Collection Time: 09/06/22  8:01 AM  Result Value Ref Range   Retic Ct Pct 0.8 0.4 - 3.1 %   RBC. 3.72 (L) 3.87 - 5.11 MIL/uL   Retic Count, Absolute 30.9 19.0 - 186.0 K/uL   Immature Retic Fract 13.9 2.3 - 15.9 %  Hemoglobin A1c   Collection Time: 09/06/22  8:01 AM  Result Value Ref Range   Hgb A1c MFr Bld 6.0 (H) 4.8 - 5.6 %   Mean Plasma Glucose 126 mg/dL  CBC with Differential/Platelet   Collection Time: 09/07/22  5:18 AM  Result Value Ref Range   WBC 8.0 4.0 - 10.5 K/uL   RBC 3.54 (L) 3.87 - 5.11 MIL/uL   Hemoglobin 8.0 (L) 12.0 - 15.0 g/dL   HCT 25.8 (L) 36.0 - 46.0 %   MCV 72.9 (L) 80.0 - 100.0 fL   MCH 22.6 (L) 26.0 - 34.0 pg  MCHC 31.0 30.0 - 36.0 g/dL   RDW 17.2 (H) 11.5 - 15.5 %   Platelets 449 (H) 150 - 400 K/uL   nRBC 0.0 0.0 - 0.2 %   Neutrophils Relative % 67 %   Neutro Abs 5.4 1.7 - 7.7 K/uL   Lymphocytes Relative 19 %   Lymphs Abs 1.5 0.7 - 4.0 K/uL   Monocytes Relative 12 %   Monocytes Absolute 0.9 0.1 - 1.0 K/uL   Eosinophils Relative 1 %   Eosinophils Absolute 0.1 0.0 - 0.5 K/uL   Basophils Relative 0 %   Basophils Absolute 0.0 0.0 - 0.1 K/uL   Immature Granulocytes 1 %   Abs Immature Granulocytes 0.06 0.00 - 0.07 K/uL   US PELVIC COMPLETE W TRANSVAGINAL AND TORSION R/O  Result Date: 09/04/2022 CLINICAL DATA:  Further evaluation of abnormal CT from same day. Pelvic pain. EXAM: TRANSABDOMINAL AND TRANSVAGINAL ULTRASOUND OF PELVIS DOPPLER ULTRASOUND OF OVARIES TECHNIQUE: Both transabdominal and transvaginal ultrasound examinations of the pelvis were performed. Transabdominal technique was performed for global imaging of the pelvis including uterus, ovaries, adnexal regions, and pelvic cul-de-sac. It was  necessary to proceed with endovaginal exam following the transabdominal exam to visualize the uterus, endometrium, ovaries and adnexa. Color and duplex Doppler ultrasound was utilized to evaluate blood flow to the ovaries. COMPARISON:  Same day CT of the abdomen pelvis FINDINGS: Uterus Measurements: 11.1 x 6.6 x 5.2 cm = volume: 200 mL. Nabothian cysts in the cervix. Endometrium Thickness: 4.3.  No focal abnormality visualized. Right ovary Measurements: 3.6 x 2.0 x 1.9 cm = volume: 7.2 mL. Normal appearance/no adnexal mass. Left ovary Heterogeneous mixed cystic and solid lesion in the left adnexa. Pulsed Doppler evaluation of both ovaries demonstrates normal low-resistance arterial and venous waveforms. Other findings Trace pelvic free fluid. IMPRESSION: Heterogeneous mixed cystic and solid lesion in the left adnexa, corresponding to the abnormality seen on CT. Findings are indeterminate with differential considerations primarily including tubo-ovarian abscess, hemorrhagic cyst, or ovarian neoplasm. Suggest further evaluation with physical examination and laboratory values with gynecologic consultation recommended. If clinical suspicion is high for tubo-ovarian abscess or hemorrhagic cyst would suggest a follow-up ultrasound in 6-8 weeks to ensure resolution. However, if clinical history is more indicative of a neoplastic process would suggest further evaluation by nonemergent pelvic ultrasound with and without intravascular contrast. Electronically Signed   By: Dahlia Bailiff M.D.   On: 09/04/2022 13:02   CT ABDOMEN PELVIS W CONTRAST  Result Date: 09/04/2022 CLINICAL DATA:  Constipation, nausea, vomiting, abdominal pain and distension. Mid lower abdominal pain for few days. EXAM: CT ABDOMEN AND PELVIS WITH CONTRAST TECHNIQUE: Multidetector CT imaging of the abdomen and pelvis was performed using the standard protocol following bolus administration of intravenous contrast. RADIATION DOSE REDUCTION: This exam was  performed according to the departmental dose-optimization program which includes automated exposure control, adjustment of the mA and/or kV according to patient size and/or use of iterative reconstruction technique. CONTRAST:  160m OMNIPAQUE IOHEXOL 300 MG/ML  SOLN COMPARISON:  None Available. FINDINGS: Lower chest: Lung bases are clear. Heart is at the upper limits of normal in size. No pericardial or pleural effusion. Distal esophagus is grossly unremarkable. Hepatobiliary: Liver is enlarged, 21.3 cm. Liver and gallbladder are otherwise unremarkable. No biliary ductal dilatation. Pancreas: Negative. Spleen: Negative. Adrenals/Urinary Tract: Adrenal glands and kidneys are unremarkable. Ureters are decompressed. Bladder is grossly unremarkable. Stomach/Bowel: Stomach, small bowel, appendix and colon are unremarkable. Vascular/Lymphatic: Vascular structures are unremarkable. No pathologically enlarged lymph  nodes. Reproductive: Complex heterogeneous left adnexal mass measures 5.1 x 5.9 cm (2/60). Stranding in the surrounding fat. Simple appearing 2.4 cm right ovarian cyst. Other: Small pelvic free fluid.  Trace perisplenic fluid. Musculoskeletal: None. IMPRESSION: 1. Complex left adnexal mass may represent a hemorrhagic left ovarian cyst. Malignancy cannot be excluded. The possibility of an associated hydro- or pyosalpinx on the left is considered. Pelvic ultrasound is recommended in further evaluation. 2. Small ascites. 3. Hepatomegaly. Electronically Signed   By: Lorin Picket M.D.   On: 09/04/2022 09:55    Future Appointments  Date Time Provider [place]  09/14/2022  2:50 PM Chancy Milroy, MD [place] None    Discharge Condition: Stable  Discharge disposition: 01-Home or Self Care       Discharge Instructions     Activity as tolerated - No restrictions   Complete by: As directed    Call MD for:  persistant nausea and vomiting   Complete by: As directed    Call MD for:  severe  uncontrolled pain   Complete by: As directed    Call MD for:  temperature >100.4   Complete by: As directed    Diet - low sodium heart healthy   Complete by: As directed    Sexual Activity Restrictions   Complete by: As directed    Avoid sexual activity until you've completed antibiotics      Allergies as of 09/07/2022   No Known Allergies      Medication List     STOP taking these medications    ADVIL PM PO       TAKE these medications    doxycycline 100 MG tablet Commonly known as: VIBRA-TABS Take 1 tablet (100 mg total) by mouth every 12 (twelve) hours for 11 days. Start taking on: September 08, 2022   FeroSul 325 (65 FE) MG tablet Generic drug: ferrous sulfate Take 1 tablet (325 mg total) by mouth every other day.   ibuprofen 200 MG tablet Commonly known as: ADVIL Take 3 tablets (600 mg total) by mouth every 6 (six) hours as needed for moderate pain. What changed: how much to take   metroNIDAZOLE 500 MG tablet Commonly known as: FLAGYL Take 1 tablet (500 mg total) by mouth every 12 (twelve) hours for 11 days.        Follow-up [place] [facility] [place] Follow up on 09/14/2022.   Specialty: Obstetrics and Gynecology Why: Please keep your follow up appointment scheduled for 3/21 at 2:50pm Contact information: [address] [place] [phone]                Total discharge time: 35 minutes   Signed: Inez Catalina M.D. 09/07/2022, 7:31 PM

## 2022-09-09 LAB — CULTURE, BLOOD (ROUTINE X 2)
Culture: NO GROWTH
Culture: NO GROWTH
Special Requests: ADEQUATE
Special Requests: ADEQUATE

## 2022-09-14 ENCOUNTER — Ambulatory Visit (INDEPENDENT_AMBULATORY_CARE_PROVIDER_SITE_OTHER): Payer: Medicaid Other | Admitting: Obstetrics and Gynecology

## 2022-09-14 ENCOUNTER — Encounter: Payer: Self-pay | Admitting: Obstetrics and Gynecology

## 2022-09-14 VITALS — BP 124/83 | HR 76 | Ht 60.0 in | Wt 196.0 lb

## 2022-09-14 DIAGNOSIS — N7093 Salpingitis and oophoritis, unspecified: Secondary | ICD-10-CM | POA: Diagnosis not present

## 2022-09-14 DIAGNOSIS — Z01419 Encounter for gynecological examination (general) (routine) without abnormal findings: Secondary | ICD-10-CM | POA: Insufficient documentation

## 2022-09-14 MED ORDER — FLUCONAZOLE 150 MG PO TABS: ORAL_TABLET | ORAL | 1 refills | Status: AC

## 2022-09-14 NOTE — Progress Notes (Signed)
Pt states she is feeling better than she was. Still having some pain - left side.    Pt is having some vaginal burning - pt states from antibiotics.  Pt would like to know if she needs to continue on abx.

## 2022-09-14 NOTE — Progress Notes (Signed)
Sara Stevens presents for hospital f/u for left  TOA See discharge summary for additional information Pt reports feeling much better Denies any bowel or bladder dysfunction No F/C/N/V Ext genital irritation   PE AF VSS Lungs clear Heart RRR Abd soft + BS non tender GU pt declined  A/P Left TOA  To complete oral antibiotic course. Diflucan for presumed yeast infection F/U GYM U/S ordered Pt to schedule yearly gyn exam ( declined today)

## 2022-09-25 ENCOUNTER — Ambulatory Visit (HOSPITAL_COMMUNITY): Admission: RE | Admit: 2022-09-25 | Payer: Medicaid Other | Source: Ambulatory Visit

## 2024-07-25 ENCOUNTER — Emergency Department (HOSPITAL_COMMUNITY)

## 2024-07-25 ENCOUNTER — Emergency Department (HOSPITAL_COMMUNITY)
Admission: EM | Admit: 2024-07-25 | Discharge: 2024-07-25 | Disposition: A | Discharge status: Home / Self Care | Attending: Emergency Medicine | Admitting: Emergency Medicine

## 2024-07-25 ENCOUNTER — Other Ambulatory Visit: Payer: Self-pay

## 2024-07-25 ENCOUNTER — Encounter (HOSPITAL_COMMUNITY): Payer: Self-pay

## 2024-07-25 DIAGNOSIS — R1030 Lower abdominal pain, unspecified: Secondary | ICD-10-CM | POA: Diagnosis present

## 2024-07-25 DIAGNOSIS — K6289 Other specified diseases of anus and rectum: Secondary | ICD-10-CM | POA: Diagnosis not present

## 2024-07-25 LAB — COMPREHENSIVE METABOLIC PANEL WITH GFR
ALT: 10 U/L (ref 0–44)
AST: 15 U/L (ref 15–41)
Albumin: 4.1 g/dL (ref 3.5–5.0)
Alkaline Phosphatase: 57 U/L (ref 38–126)
Anion gap: 9 (ref 5–15)
BUN: 7 mg/dL (ref 6–20)
CO2: 23 mmol/L (ref 22–32)
Calcium: 8.8 mg/dL — ABNORMAL LOW (ref 8.9–10.3)
Chloride: 106 mmol/L (ref 98–111)
Creatinine, Ser: 0.76 mg/dL (ref 0.44–1.00)
GFR, Estimated: >60 mL/min
Glucose, Bld: 92 mg/dL (ref 70–99)
Potassium: 4.7 mmol/L (ref 3.5–5.1)
Sodium: 138 mmol/L (ref 135–145)
Total Bilirubin: <0.2 mg/dL (ref 0.0–1.2)
Total Protein: 6.9 g/dL (ref 6.5–8.1)

## 2024-07-25 LAB — URINALYSIS, ROUTINE W REFLEX MICROSCOPIC
Bilirubin Urine: NEGATIVE
Glucose, UA: NEGATIVE mg/dL
Ketones, ur: NEGATIVE mg/dL
Leukocytes,Ua: NEGATIVE
Nitrite: NEGATIVE
Protein, ur: NEGATIVE mg/dL
RBC / HPF: >50 RBC/hpf (ref 0–5)
Specific Gravity, Urine: 1.006 (ref 1.005–1.030)
pH: 5 (ref 5.0–8.0)

## 2024-07-25 LAB — HCG, SERUM, QUALITATIVE: Preg, Serum: NEGATIVE

## 2024-07-25 LAB — CBC
HCT: 28.6 % — ABNORMAL LOW (ref 36.0–46.0)
Hemoglobin: 8 g/dL — ABNORMAL LOW (ref 12.0–15.0)
MCH: 19.2 pg — ABNORMAL LOW (ref 26.0–34.0)
MCHC: 28 g/dL — ABNORMAL LOW (ref 30.0–36.0)
MCV: 68.6 fL — ABNORMAL LOW (ref 80.0–100.0)
Platelets: 330 10*3/uL (ref 150–400)
RBC: 4.17 MIL/uL (ref 3.87–5.11)
RDW: 19.5 % — ABNORMAL HIGH (ref 11.5–15.5)
WBC: 5.2 10*3/uL (ref 4.0–10.5)
nRBC: 0 % (ref 0.0–0.2)

## 2024-07-25 LAB — LIPASE, BLOOD: Lipase: 30 U/L (ref 11–51)

## 2024-07-25 MED ORDER — LIDOCAINE HCL 2 % EX GEL: 1.0000 | CUTANEOUS | 0 refills | Status: AC | PRN

## 2024-07-25 MED ORDER — LIDOCAINE HCL URETHRAL/MUCOSAL 2 % EX GEL
1.0000 | Freq: Once | CUTANEOUS | Status: AC
  Administered 2024-07-25: 1 via TOPICAL
  Filled 2024-07-25: qty 11

## 2024-07-25 MED ORDER — IOHEXOL 350 MG/ML SOLN
75.0000 mL | Freq: Once | INTRAVENOUS | Status: AC | PRN
  Administered 2024-07-25: 75 mL via INTRAVENOUS

## 2024-07-25 NOTE — ED Triage Notes (Signed)
 Pt reports lower abdominal pain that has been ongoing for a few weeks. States she has been taking pain pills without relief and the pain is unbearable.

## 2024-07-25 NOTE — ED Triage Notes (Signed)
 Pt came in reporting pain in her lower abdomin and hemorrhoid pain. Pt reports that it feels similar to when she had a ovarian cyst.

## 2024-07-25 NOTE — ED Notes (Signed)
 Pt transported to CT ?

## 2024-07-25 NOTE — ED Provider Notes (Signed)
 " [place] EMERGENCY DEPARTMENT AT [facility] Provider Note   CSN: [id number] Arrival date & time: 07/25/24  1126     Patient presents with: Abdominal Pain   Sara Stevens is a 39 y.o. female.   Patient with no pertinent past medical history presents today with complaints of abdominal pain, rectal pain.  Reports that her symptoms have been ongoing for the last few weeks, initially her pain was in her lower abdomen and now is exclusively in her rectum.  Reports that she is having difficulty sitting due to pain. Denies any history of similar pain previously.  Denies nausea, vomiting, or diarrhea.  Reports she is having regular bowel movements, however having a bowel movement is uncomfortable for her.  Denies any history of anal intercourse, no foreign bodies, no anal discharge.  Reports she has had hemorrhoids previously, however this feels unlike hemorrhoid she has had previously.  Denies any hematochezia or melena. No history of abdominal surgeries. Reports she is currently on her menstrual cycle.  The history is provided by the patient. No language interpreter was used.  Abdominal Pain      Prior to Admission medications  Medication Sig Start Date End Date Taking? Authorizing Provider  ferrous sulfate  325 (65 FE) MG tablet Take 1 tablet (325 mg total) by mouth every other day. Patient not taking: Reported on 09/14/2022 09/07/22   Erik Kieth BROCKS, MD  fluconazole  (DIFLUCAN ) 150 MG tablet Take 1 tablet now and repeat in 3 days 09/14/22   Ervin, Michael L, MD  ibuprofen  (ADVIL ) 200 MG tablet Take 3 tablets (600 mg total) by mouth every 6 (six) hours as needed for moderate pain. Patient not taking: Reported on 09/14/2022 09/07/22   Erik Kieth BROCKS, MD    Allergies: Patient has no known allergies.    Review of Systems  Gastrointestinal:  Positive for abdominal pain.  All other systems reviewed and are negative.   Updated Vital Signs BP (!) 143/88 (BP Location: Left  Arm)   Pulse 63   Temp 98.4 F (36.9 C) (Oral)   Resp 18   Ht 5' (1.524 m)   Wt 88 kg   SpO2 100%   BMI 37.89 kg/m   Physical Exam Vitals and nursing note reviewed.  Constitutional:      General: She is not in acute distress.    Appearance: Normal appearance. She is normal weight. She is not ill-appearing, toxic-appearing or diaphoretic.  HENT:     Head: Normocephalic and atraumatic.  Cardiovascular:     Rate and Rhythm: Normal rate.  Pulmonary:     Effort: Pulmonary effort is normal. No respiratory distress.  Abdominal:     General: Abdomen is flat.     Palpations: Abdomen is soft.     Tenderness: There is no abdominal tenderness.  Genitourinary:    Comments: No hemorrhoids or fissures or other abnormalities visualized on exam. Tenderness noted to direct palpation of the anus. Internal exam deferred due to pain. Musculoskeletal:        General: Normal range of motion.     Cervical back: Normal range of motion.  Skin:    General: Skin is warm and dry.  Neurological:     General: No focal deficit present.     Mental Status: She is alert.  Psychiatric:        Mood and Affect: Mood normal.        Behavior: Behavior normal.     (all labs ordered are listed,  but only abnormal results are displayed) Labs Reviewed  COMPREHENSIVE METABOLIC PANEL WITH GFR - Abnormal; Notable for the following components:      Result Value   Calcium 8.8 (*)    All other components within normal limits  CBC - Abnormal; Notable for the following components:   Hemoglobin 8.0 (*)    HCT 28.6 (*)    MCV 68.6 (*)    MCH 19.2 (*)    MCHC 28.0 (*)    RDW 19.5 (*)    All other components within normal limits  URINALYSIS, ROUTINE W REFLEX MICROSCOPIC - Abnormal; Notable for the following components:   APPearance HAZY (*)    Hgb urine dipstick LARGE (*)    Bacteria, UA MANY (*)    All other components within normal limits  LIPASE, BLOOD  HCG, SERUM, QUALITATIVE     EKG: None  Radiology: CT ABDOMEN PELVIS W CONTRAST Result Date: 07/25/2024 EXAM: CT ABDOMEN AND PELVIS WITH CONTRAST 07/25/2024 06:44:21 PM TECHNIQUE: CT of the abdomen and pelvis was performed with the administration of 75 mL of iohexol  (OMNIPAQUE ) 350 MG/ML injection. Multiplanar reformatted images are provided for review. Automated exposure control, iterative reconstruction, and/or weight-based adjustment of the mA/kV was utilized to reduce the radiation dose to as low as reasonably achievable. COMPARISON: Comparison with 09/04/2022. CLINICAL HISTORY: Abdominal pain, acute, nonlocalized. Acute, nonlocalized abdominal pain. FINDINGS: LOWER CHEST: No acute abnormality. LIVER: The liver is unremarkable. GALLBLADDER AND BILE DUCTS: Gallbladder is unremarkable. No biliary ductal dilatation. SPLEEN: No acute abnormality. PANCREAS: No acute abnormality. ADRENAL GLANDS: No acute abnormality. KIDNEYS, URETERS AND BLADDER: No stones in the kidneys or ureters. No hydronephrosis. No perinephric or periureteral stranding. Urinary bladder is unremarkable. GI AND BOWEL: Stomach demonstrates no acute abnormality. There is no bowel obstruction. Normal appendix. PERITONEUM AND RETROPERITONEUM: No ascites. No free air. VASCULATURE: Aorta is normal in caliber. LYMPH NODES: No lymphadenopathy. REPRODUCTIVE ORGANS: Fibroid in the left uterus. BONES AND SOFT TISSUES: No acute osseous abnormality. No focal soft tissue abnormality. IMPRESSION: 1. No acute findings in the abdomen or pelvis. Electronically signed by: Norman Gatlin MD 07/25/2024 07:49 PM EST RP Workstation: [id number]     Procedures   Medications Ordered in the ED  iohexol  (OMNIPAQUE ) 350 MG/ML injection 75 mL (75 mLs Intravenous Contrast Given 07/25/24 1844)                                    Medical Decision Making Amount and/or Complexity of Data Reviewed Labs: ordered. Radiology: ordered.  Risk Prescription drug management.   This  patient is a 38 y.o. female who presents to the ED for concern of abdominal pain, rectal pain, this involves an extensive number of treatment options, and is a complaint that carries with it a high risk of complications and morbidity. The emergent differential diagnosis prior to evaluation includes, but is not limited to,  hemorrhoids, fissures, abscess, STD, proctitis, foreign body, neoplasm . This is not an exhaustive differential.   Past Medical History / Co-morbidities / Social History:  has no past medical history on file.  Additional history: Chart reviewed.   Physical Exam: Physical exam performed. The pertinent findings include: abdomen soft and nontender, no hemorrhoids or fissures or other abnormalities visualized on exam. Tenderness noted to direct palpation of the anus. Internal exam deferred due to pain.  Lab Tests: I ordered, and personally interpreted labs.  The pertinent results include: No  leukocytosis, hemoglobin 8 (consistent with previous, patient reports history of anemia), UA with RBCs consistent with menstrual cycle, noninfectious.  No other acute laboratory abnormalities   Imaging Studies: I ordered imaging studies including CT abdomen pelvis. I independently visualized and interpreted imaging which showed no acute findings. I agree with the radiologist interpretation.   Medications: I ordered medication including topical lidocaine   for pain. Reevaluation of the patient after these medicines showed that the patient improved. I have reviewed the patients home medicines and have made adjustments as needed.   Disposition: After consideration of the diagnostic results and the patients response to treatment, I feel that emergency department workup does not suggest an emergent condition requiring admission or immediate intervention beyond what has been performed at this time. The plan is: Discharge with close outpatient follow-up and return precautions.  Patient had some  improvement with the lidocaine  jelly and therefore I have prescribed her this.  Her workup was benign.  I do suspect she has a small fissure causing her symptoms, she has no STI symptoms or concerns. Recommend sitz bath's, stool softeners, advised to hemorrhoid pillow for offloading of pressure with sitting. Evaluation and diagnostic testing in the emergency department does not suggest an emergent condition requiring admission or immediate intervention beyond what has been performed at this time.  Plan for discharge with close PCP follow-up.  Patient is understanding and amenable with plan, educated on red flag symptoms that would prompt immediate return.  Patient discharged in stable condition.  Findings and plan of care discussed with supervising physician Dr. Randol who is in agreement.   Final diagnoses:  Rectal pain    ED Discharge Orders          Ordered    lidocaine  (XYLOCAINE ) 2 % jelly  As needed        07/25/24 2045          An After Visit Summary was printed and given to the patient.     Bertrice Leder A, PA-C 07/25/24 [id number]    Randol Simmonds, MD 07/25/24 [phone]  "

## 2024-07-25 NOTE — Discharge Instructions (Signed)
 As we discussed, your workup in the ER today was reassuring for acute findings.  Laboratory evaluation and CT imaging did not reveal any emergent cause of your symptoms.  I do suspect that you have a small fissure that is causing your pain.  I have given you a prescription for topical numbing gel that you can use as prescribed as needed for management of your pain.  I recommend that you try sitting in warm water with Epsom salts, wash the area with soap and warm water regularly, you can purchase a hemorrhoid pillow online or at your local pharmacy to help offload pressure in the area when you are sitting.  You can take Tylenol /ibuprofen  as needed for pain.  Please call your primary care provider to schedule a close follow-up appointment for continued evaluation and management of your symptoms.  Additionally, I recommend that you purchase a stool softener such as Metamucil or MiraLAX  to help decrease the pain you have with bowel movements. You can also purchase wet wipes to use which may be more soothing to the area than toilet paper.  Return if development of any new or worsening symptoms.
